# Patient Record
Sex: Female | Born: 1975 | Race: White | Hispanic: No | Marital: Married
Health system: Southern US, Community
[De-identification: ages and names within clinical notes are randomized; demographics above are authoritative.]

---

## 2015-06-14 ENCOUNTER — Other Ambulatory Visit: Payer: Self-pay | Admitting: Unknown Physician Specialty

## 2015-06-14 DIAGNOSIS — N6320 Unspecified lump in the left breast, unspecified quadrant: Secondary | ICD-10-CM

## 2015-06-15 ENCOUNTER — Other Ambulatory Visit: Payer: Self-pay | Admitting: Unknown Physician Specialty

## 2015-06-15 DIAGNOSIS — N6320 Unspecified lump in the left breast, unspecified quadrant: Secondary | ICD-10-CM

## 2015-06-19 ENCOUNTER — Ambulatory Visit
Admission: RE | Admit: 2015-06-19 | Discharge: 2015-06-19 | Disposition: A | Discharge status: Home / Self Care | Payer: PRIVATE HEALTH INSURANCE | Source: Ambulatory Visit | Attending: Unknown Physician Specialty | Admitting: Unknown Physician Specialty

## 2015-06-19 ENCOUNTER — Other Ambulatory Visit: Payer: Self-pay | Admitting: Unknown Physician Specialty

## 2015-06-19 DIAGNOSIS — N6320 Unspecified lump in the left breast, unspecified quadrant: Secondary | ICD-10-CM

## 2015-11-27 ENCOUNTER — Encounter: Payer: Self-pay | Admitting: Pediatrics

## 2017-09-17 IMAGING — MG MM DIAG BREAST TOMO BILATERAL
6 of 10 series · 6 of 30 positions shown · non-contrast
Comparison: Left mammogram and left breast ultrasound 06/05/2009
from [facility] [REDACTED] in the [HOSPITAL], [facility] [[facility]].

CLINICAL DATA: 39-year-old with a very tender palpable lump in the
upper inner left breast which she noticed approximately 1 month ago.
Annual evaluation, right breast.

EXAM:
DIGITAL DIAGNOSTIC BILATERAL MAMMOGRAM WITH 3D TOMOSYNTHESIS WITH
CAD
ULTRASOUND LEFT BREAST

[L CC (1 of 2)]
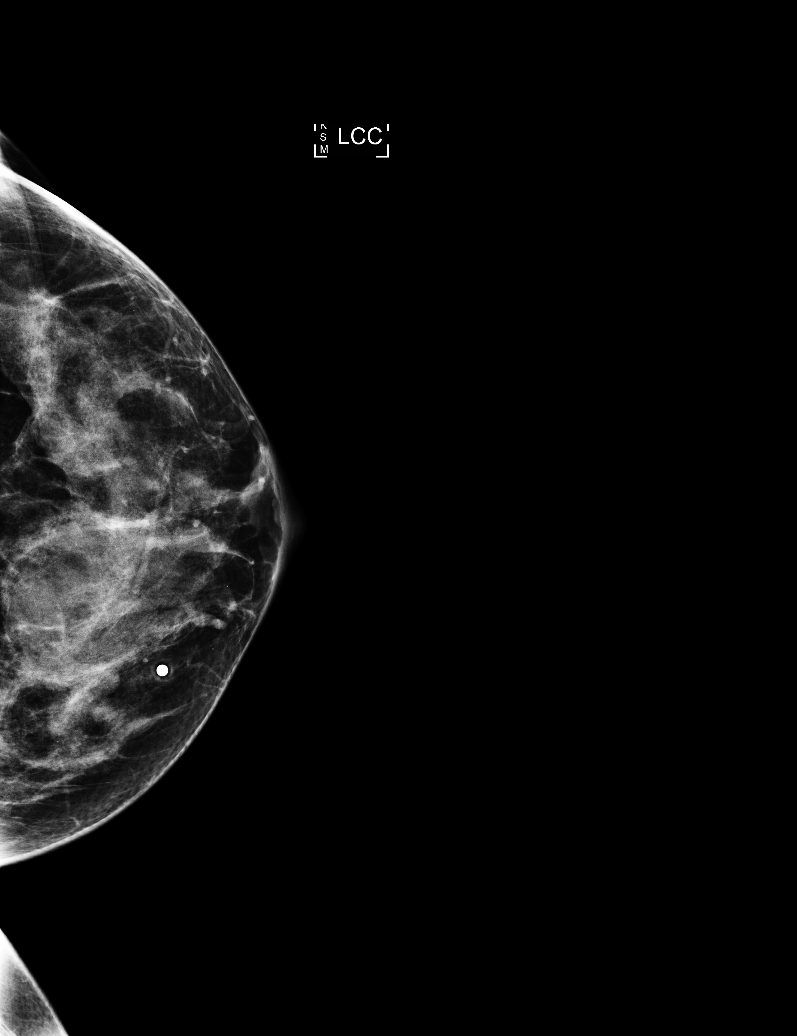

[L CC (2 of 2)]
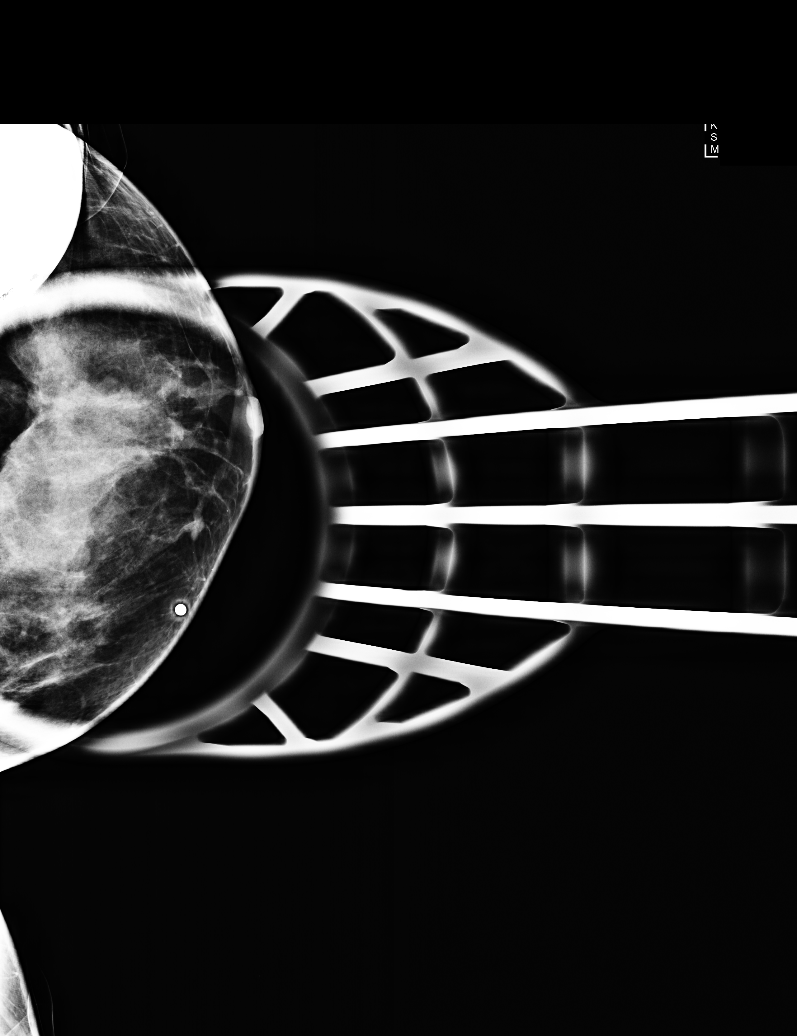

[L MLO]
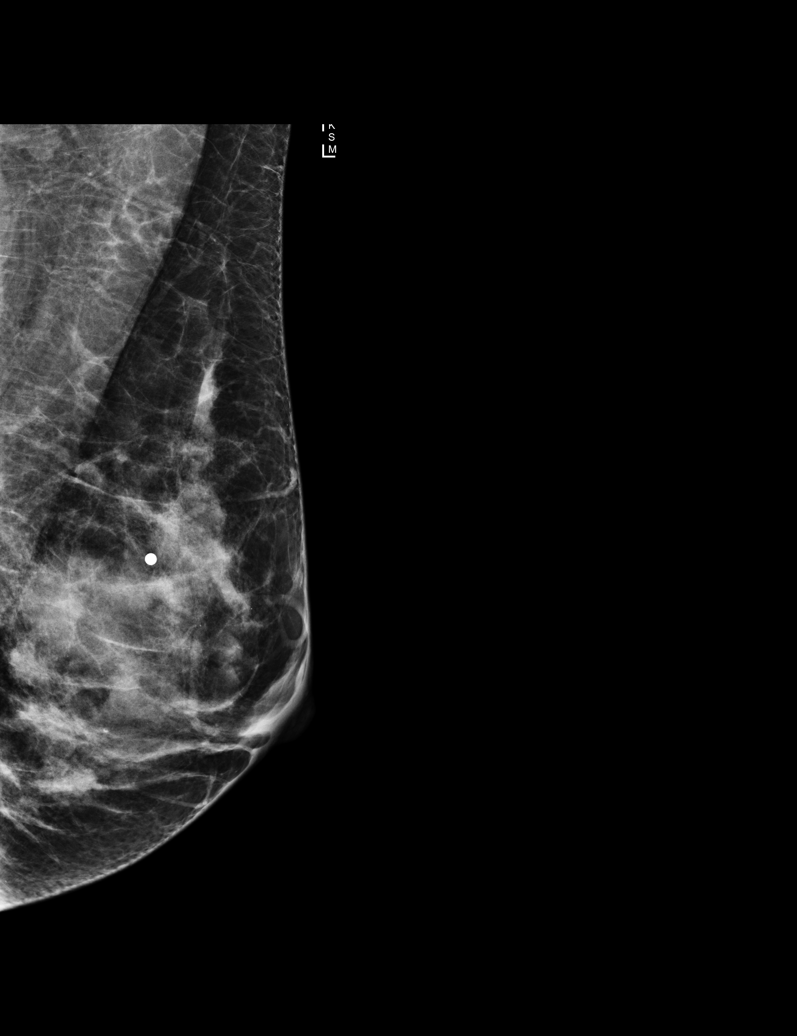

[R MLO]
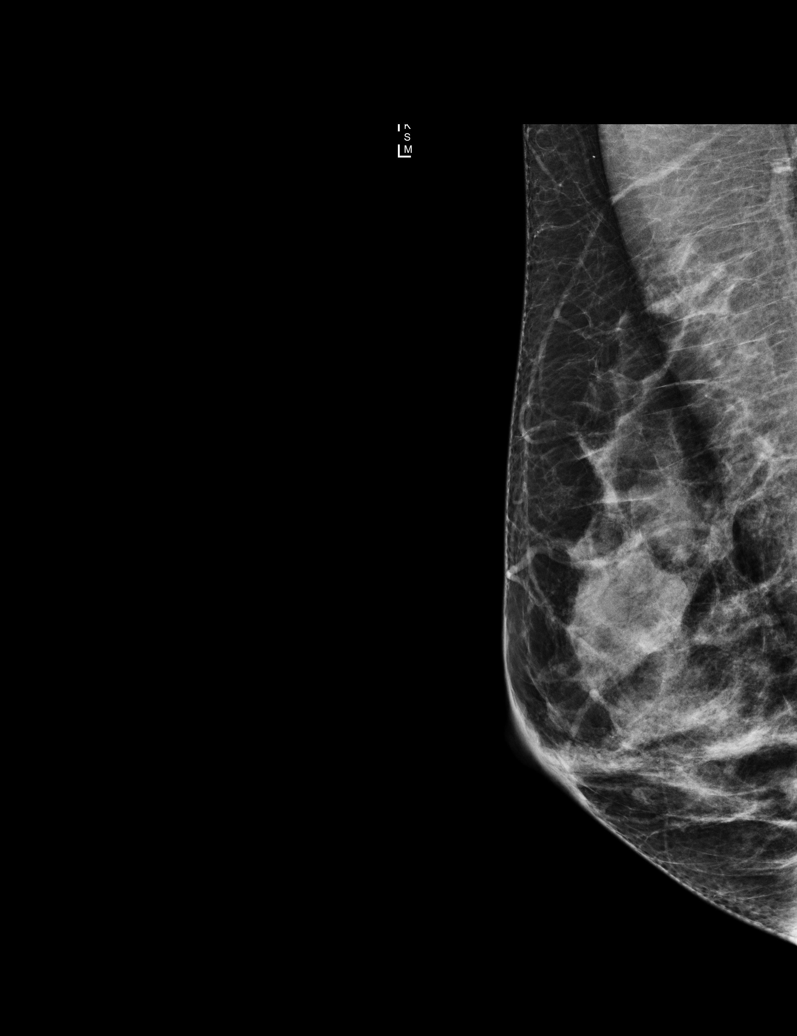

[R CC]
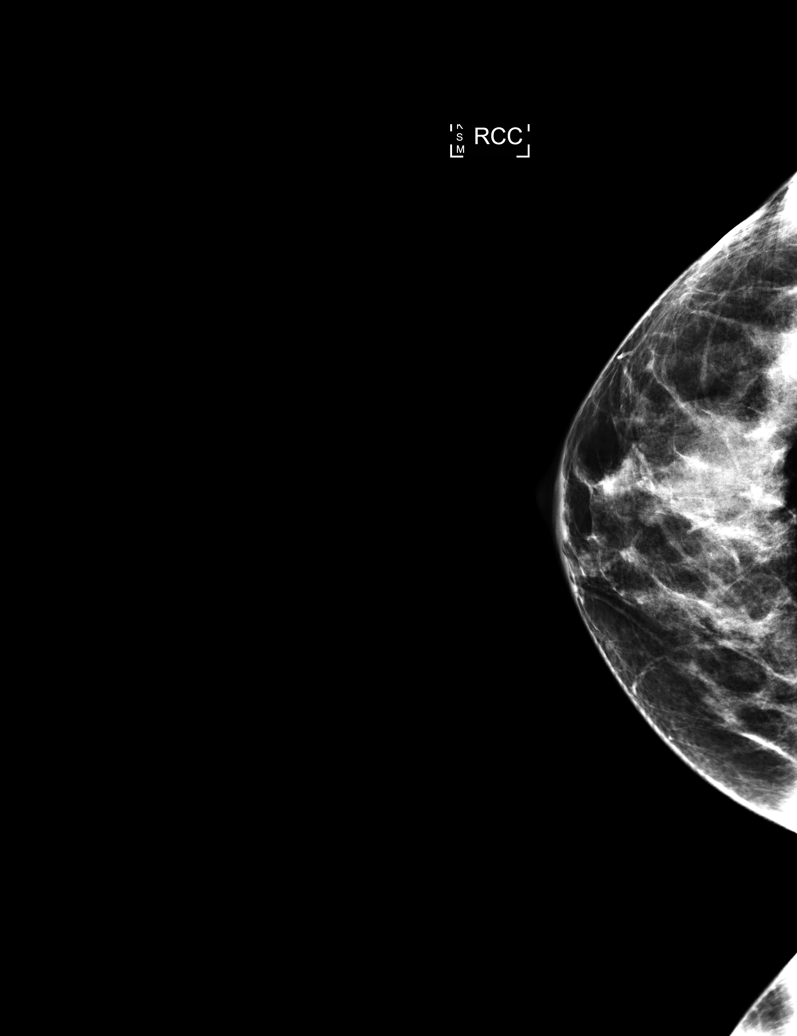

[R MLO tomo · tomo slice 29/57.0]
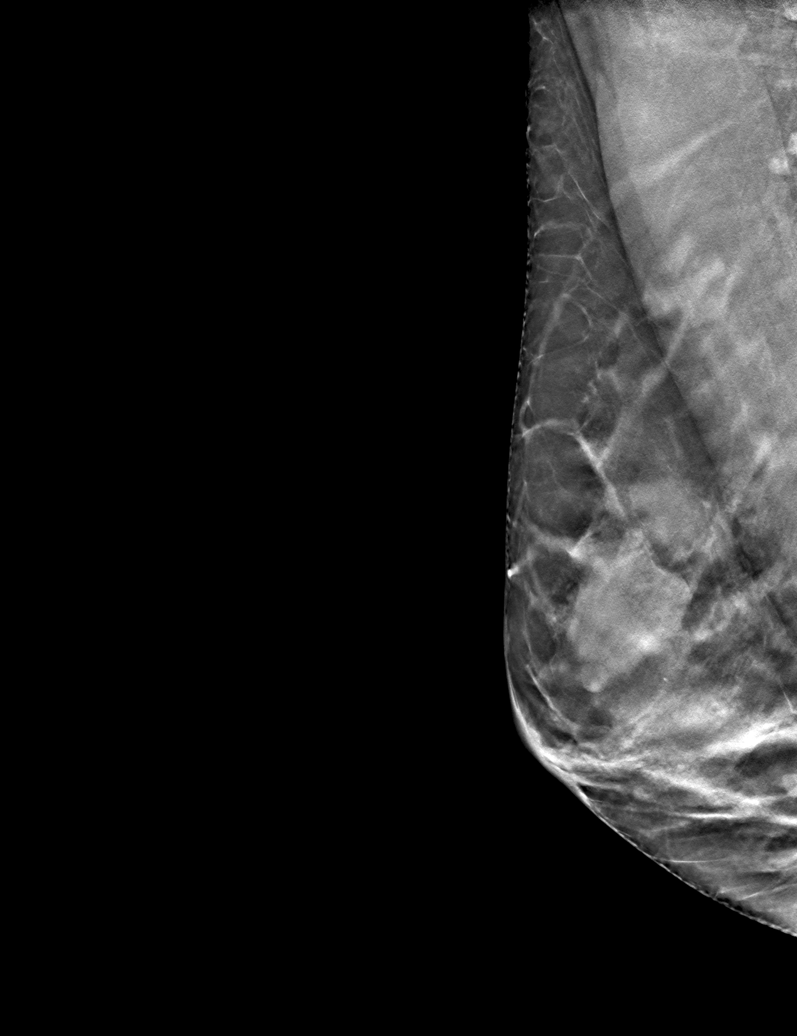

[6 of 30 positions shown; findings below may reference images not displayed]

That
ultrasound was performed in a different part of the breast.

ACR Breast Density Category d: The breast tissue is extremely dense,
which lowers the sensitivity of mammography.
FINDINGS: CC and MLO views of both breasts and a spot tangential view of the
area palpable concern in the left breast were obtained, all with
tomosynthesis. In the area palpable concern on the tangential view
is a circumscribed low-density mass approximating 6 mm without
associated architectural distortion or suspicious calcifications. An
approximate 2 cm circumscribed low-density mass is present deep at
the site of palpable concern, also without associated architectural
distortion or suspicious calcifications. No suspicious findings
elsewhere in the left breast.

No findings suspicious for malignancy in the right breast.

Mammographic images were processed with CAD.

On physical exam, there is a tender palpable approximate 2-3 cm
mobile mass in the upper inner left breast corresponding to what the
patient is feeling.

Targeted left breast ultrasound is performed, showing an oval
circumscribed anechoic mass with internal echoes and no internal
power Doppler flow at the 10:30 o'clock position approximately 3 cm
from the nipple measuring approximately 2.7 x 1.6 x 2.1 cm,
demonstrating excellent acoustic enhancement. Multiple smaller cysts
are present in the upper inner quadrant, one of which corresponds to
the mammographic finding.
IMPRESSION: 1. Likely benign complex cyst in the upper inner quadrant of the
left breast accounting for the area of palpable concern. Multiple
smaller cysts are present in the upper inner quadrant of the left
breast.
2. No mammographic evidence of malignancy, right breast.

RECOMMENDATION:
Ultrasound-guided left breast cyst aspiration to confirm that the
internal contents of the cyst are completely removed in order to
confirm benignity.

I have discussed the findings and recommendations with the patient.
Results were also provided in writing at the conclusion of the
visit.

BI-RADS CATEGORY  0: Incomplete. Need additional imaging evaluation
and/or prior mammograms for comparison.

Ultrasound-guided left breast cyst aspiration was performed
subsequently and is reported separately.

## 2019-03-23 ENCOUNTER — Other Ambulatory Visit: Payer: Self-pay

## 2019-03-23 ENCOUNTER — Ambulatory Visit: Payer: Commercial Managed Care - PPO | Attending: Orthopedic Surgery | Admitting: Physical Therapy

## 2019-03-23 ENCOUNTER — Encounter: Payer: Self-pay | Admitting: Physical Therapy

## 2019-03-23 DIAGNOSIS — M6281 Muscle weakness (generalized): Secondary | ICD-10-CM

## 2019-03-23 DIAGNOSIS — G8929 Other chronic pain: Secondary | ICD-10-CM | POA: Diagnosis present

## 2019-03-23 DIAGNOSIS — M25512 Pain in left shoulder: Secondary | ICD-10-CM

## 2019-03-23 DIAGNOSIS — M25612 Stiffness of left shoulder, not elsewhere classified: Secondary | ICD-10-CM | POA: Diagnosis present

## 2019-03-23 NOTE — Therapy (Signed)
[facility] [address] [place], [place], [address] Phone: [phone]   Fax:  [phone]  Physical Therapy Evaluation  Patient Details  Name: Jillian PiccoloDonna Herring MRN: [id number] Date of Birth: [birth year] Referring Provider (PT): Viviann SpareSteven Case, MD   Encounter Date: 03/23/2019  PT End of Session - 03/23/19 1258    Visit Number  1    Number of Visits  24    Date for PT Re-Evaluation  05/18/19    Authorization Type  FOTO; progress note at 10th visit    PT Start Time  0815    PT Stop Time  0902    PT Time Calculation (min)  47 min    Activity Tolerance  Patient tolerated treatment well    Behavior During Therapy  [facility] for tasks assessed/performed       History reviewed. No pertinent past medical history.  History reviewed. No pertinent surgical history.  There were no vitals filed for this visit.   Subjective Assessment - 03/23/19 1251    Subjective  COVID-19 screening performed upon arrival.Patient arrives to physical therapy with reports of left shoulder pain and decreased ROM that has progressively worsened since July 2020. Patient reports receiving an injection last week which helped alleviate constant pain, no pain only occurs with ROM. Patient requires increased time to perform ADLs particularly dressing and overhead activities. Patient obtains assistance from her daughter for home activities. Patient's pain at worst is 8/10 and pain at best is 0/10 with rest. Patient's goals are to decrease pain improve movement and improve ability to perform home and yard work tasks.    Pertinent History  unremarkable    Limitations  Lifting;House hold activities    Diagnostic tests  MRI: increased inferior glenohumeral ligament thickness; X-Ray: normal    Patient Stated Goals  decrease pain, improve ROM    Currently in Pain?  Yes    Pain Score  1     Pain Location  Shoulder    Pain Orientation  Left    Pain Descriptors / Indicators  Sore;Constant     Pain Type  Chronic pain    Pain Onset  More than a month ago    Pain Frequency  Constant    Aggravating Factors   movement    Pain Relieving Factors  Rest    Effect of Pain on Daily Activities  pain with movement; decreased range, difficulties with dressing         [facility] PT Assessment - 03/23/19 0001      Assessment   Medical Diagnosis  Chronic left shoulder pain; adhesive capsulitis    Referring Provider (PT)  Viviann SpareSteven Case, MD    Onset Date/Surgical Date  --   July 2020   Hand Dominance  Right    Next MD Visit  04/27/2019    Prior Therapy  no      Precautions   Precautions  None      Restrictions   Weight Bearing Restrictions  No      Balance Screen   Has the patient fallen in the past 6 months  Yes    How many times?  1    Has the patient had a decrease in activity level because of a fear of falling?   No    Is the patient reluctant to leave their home because of a fear of falling?   No      Home Nurse, mental healthnvironment   Living Environment  Private residence    Living  Arrangements  Children      Prior Function   Level of Independence  Independent with basic ADLs;Needs assistance with homemaking      Observation/Other Assessments   Focus on Therapeutic Outcomes (FOTO)   53% limitation      Posture/Postural Control   Posture/Postural Control  Postural limitations    Postural Limitations  Rounded Shoulders;Forward head      ROM / Strength   AROM / PROM / Strength  AROM;PROM;Strength      AROM   Overall AROM Comments  full right shoulder AROM in all planes    AROM Assessment Site  Shoulder    Right/Left Shoulder  Left    Left Shoulder Flexion  81 Degrees    Left Shoulder ABduction  54 Degrees    Left Shoulder Internal Rotation  40 Degrees    Left Shoulder External Rotation  5 Degrees      PROM   PROM Assessment Site  Shoulder    Right/Left Shoulder  Left    Left Shoulder Flexion  112 Degrees    Left Shoulder ABduction  60 Degrees    Left Shoulder Internal Rotation   42 Degrees    Left Shoulder External Rotation  9 Degrees      Strength   Strength Assessment Site  Shoulder    Right/Left Shoulder  Left    Left Shoulder Flexion  3-/5    Left Shoulder ABduction  3-/5    Left Shoulder Internal Rotation  3+/5    Left Shoulder External Rotation  3+/5      Palpation   Palpation comment  minimal tenderness to palpation to left shoulder musculature; slight increase of left shoulder muscle tone                Objective measurements completed on examination: See above findings.      [facility] Adult PT Treatment/Exercise - 03/23/19 0001      Modalities   Modalities  Electrical Stimulation      Electrical Stimulation   Electrical Stimulation Location  left shoulder    Electrical Stimulation Action  IFC    Electrical Stimulation Parameters  80-150 hz x10 mins    Electrical Stimulation Goals  Pain             PT Education - 03/23/19 1255    Education Details  AAROM flexion, AAROM abduction, AAROM ER, AAROM scapular squeeze    Person(s) Educated  Patient    Methods  Explanation;Demonstration;Handout    Comprehension  Verbalized understanding;Returned demonstration          PT Long Term Goals - 03/23/19 1301      PT LONG TERM GOAL #1   Title  Patient will be independent with HEP    Time  8    Period  Weeks    Status  New      PT LONG TERM GOAL #2   Title  Patient will demonstrate 150+ degrees of left shoulder flexion AROM to improve ability to perform overhead activities.    Time  8    Period  Weeks    Status  New      PT LONG TERM GOAL #3   Title  Patient will demonstrate 55+ degrees of left shoulder ER AROM to improve donning/doffing apparel.    Time  8    Period  Weeks    Status  New      PT LONG TERM GOAL #4   Title  Patient will demonstrate  4/5 left shoulder MMT in all planes to improve stability during functional tasks.    Time  8    Period  Weeks    Status  New      PT LONG TERM GOAL #5   Title  Patient  will report ability to perform ADLs with left shoulder pain less than or equal to 3/10.    Time  8    Period  Weeks    Status  New             Plan - 03/23/19 1314    Clinical Impression Statement  Patient is a right handed 43 year old female who presents to physical therapy with decreased left shoulder AROM and PROM and decreased left shoulder MMT that has progressively worsened since July 2020. Patient noted with poor scapulohumeral rhythm with ROM and with shoulder hiking compensatory movements with flexion and abduction. Patient denied tenderness to palpation to left shoulder. Patient and PT discussed HEP as well as plan of care to address deficits. Patient reported understanding and agreement. Patient would benefit from skilled physical therapy to address deficits and address patient's goals.    Personal Factors and Comorbidities  Age;Time since onset of injury/illness/exacerbation    Examination-Activity Limitations  Bathing;Reach Overhead;Dressing;Hygiene/Grooming;Lift;Sleep    Examination-Participation Restrictions  Yard Work;Cleaning;Driving    Stability/Clinical Decision Making  Stable/Uncomplicated    Clinical Decision Making  Low    Rehab Potential  Good    PT Frequency  3x / week    PT Duration  8 weeks    PT Treatment/Interventions  ADLs/Self Care Home Management;Cryotherapy;Electrical Stimulation;Iontophoresis 4mg /ml Dexamethasone;Moist Heat;Ultrasound;Therapeutic exercise;Patient/family education;Therapeutic activities;Neuromuscular re-education;Manual techniques;Vasopneumatic Device;Taping;Dry needling;Passive range of motion    PT Next Visit Plan  AROM and PROM to left shoulder, modalities PRN for pain relief.    PT Home Exercise Plan  see patient education    Consulted and Agree with Plan of Care  Patient       Patient will benefit from skilled therapeutic intervention in order to improve the following deficits and impairments:  Decreased activity tolerance, Decreased  strength, Decreased range of motion, Postural dysfunction, Pain  Visit Diagnosis: Chronic left shoulder pain  Stiffness of left shoulder, not elsewhere classified  Muscle weakness (generalized)     Problem List There are no active problems to display for this patient.   , PT, DPT 03/23/2019, 1:18 PM  [facility] [address] [place], [place], [place] Phone: [phone]   Fax:  [phone]  Name: Malynda Smolinski MRN: Jillian Herring Date of Birth: 17-Jan-1976

## 2019-03-25 ENCOUNTER — Other Ambulatory Visit: Payer: Self-pay

## 2019-03-25 ENCOUNTER — Encounter: Payer: Self-pay | Admitting: Physical Therapy

## 2019-03-25 ENCOUNTER — Ambulatory Visit: Payer: Commercial Managed Care - PPO | Admitting: Physical Therapy

## 2019-03-25 DIAGNOSIS — M25612 Stiffness of left shoulder, not elsewhere classified: Secondary | ICD-10-CM

## 2019-03-25 DIAGNOSIS — M6281 Muscle weakness (generalized): Secondary | ICD-10-CM

## 2019-03-25 DIAGNOSIS — M25512 Pain in left shoulder: Secondary | ICD-10-CM | POA: Diagnosis not present

## 2019-03-25 DIAGNOSIS — G8929 Other chronic pain: Secondary | ICD-10-CM

## 2019-03-25 NOTE — Therapy (Signed)
[facility] [place], [place], [address] Phone: [phone]   Fax:  [phone]  Physical Therapy Treatment  Patient Details  Name: Jillian Herring MRN: [id number] Date of Birth: February 18, 1976 Referring Provider (PT): Remo Lipps Case, MD   Encounter Date: 03/25/2019  PT End of Session - 03/25/19 0820    Visit Number  2    Number of Visits  24    Date for PT Re-Evaluation  05/18/19    Authorization Type  FOTO; progress note at 10th visit    PT Start Time  0815    PT Stop Time  0908    PT Time Calculation (min)  53 min    Activity Tolerance  Patient tolerated treatment well    Behavior During Therapy  [facility] for tasks assessed/performed       History reviewed. No pertinent past medical history.  History reviewed. No pertinent surgical history.  There were no vitals filed for this visit.  Subjective Assessment - 03/25/19 0818    Subjective  COVID-19 screening performed upon arrival. Patient reports pain with movement and compliance with HEP. Patient reports performing HEP morning and evenings and evenings are a little stiffer but able to complete.    Pertinent History  unremarkable    Limitations  Lifting;House hold activities    Diagnostic tests  MRI: increased inferior glenohumeral ligament thickness; X-Ray: normal    Patient Stated Goals  decrease pain, improve ROM    Currently in Pain?  Yes    Pain Score  5     Pain Location  Shoulder    Pain Orientation  Left    Pain Descriptors / Indicators  Sore;Tightness    Pain Type  Chronic pain    Pain Onset  More than a month ago    Pain Frequency  Constant         [facility] PT Assessment - 03/25/19 0001      Assessment   Medical Diagnosis  Chronic left shoulder pain; adhesive capsulitis    Referring Provider (PT)  Remo Lipps Case, MD    Hand Dominance  Right    Next MD Visit  04/27/2019    Prior Therapy  no      Precautions   Precautions  None      Restrictions   Weight  Bearing Restrictions  No                   [facility] Adult PT Treatment/Exercise - 03/25/19 0001      Exercises   Exercises  Shoulder      Shoulder Exercises: Pulleys   Flexion  3 minutes      Shoulder Exercises: ROM/Strengthening   Ranger  seated ranger, flexion, clockwise, counter clockwise x2 minutes each      Modalities   Modalities  Electrical Stimulation      Electrical Stimulation   Electrical Stimulation Location  left shouler    Electrical Stimulation Action  IFC    Electrical Stimulation Parameters  80-150 hz x10 mins    Electrical Stimulation Goals  Pain      Manual Therapy   Manual Therapy  Joint mobilization;Passive ROM    Joint Mobilization  inferior and posterior grade 3 joint mobs to improve ROM    Passive ROM  PROM into flexion, ER, IR to improve ROM; intermittent oscillations to decrease muscle guarding.                  PT Long Term Goals -  03/23/19 1301      PT LONG TERM GOAL #1   Title  Patient will be independent with HEP    Time  8    Period  Weeks    Status  New      PT LONG TERM GOAL #2   Title  Patient will demonstrate 150+ degrees of left shoulder flexion AROM to improve ability to perform overhead activities.    Time  8    Period  Weeks    Status  New      PT LONG TERM GOAL #3   Title  Patient will demonstrate 55+ degrees of left shoulder ER AROM to improve donning/doffing apparel.    Time  8    Period  Weeks    Status  New      PT LONG TERM GOAL #4   Title  Patient will demonstrate 4/5 left shoulder MMT in all planes to improve stability during functional tasks.    Time  8    Period  Weeks    Status  New      PT LONG TERM GOAL #5   Title  Patient will report ability to perform ADLs with left shoulder pain less than or equal to 3/10.    Time  8    Period  Weeks    Status  New            Plan - 03/25/19 0913    Clinical Impression Statement  Patient responsed well to therapy but with slight increase of  pain at end range. Patient noted with decreased posterior joint mobility but denied disocomfort with manual technique. Patient educated on plan of care as well importance of continuing HEP to maximize PT. Patient reported understanding. Patient would benefit from skilled physical therapy to address deficits and address patient's goals.    Personal Factors and Comorbidities  Age;Time since onset of injury/illness/exacerbation    Examination-Activity Limitations  Bathing;Reach Overhead;Dressing;Hygiene/Grooming;Lift;Sleep    Examination-Participation Restrictions  Yard Work;Cleaning;Driving    Stability/Clinical Decision Making  Stable/Uncomplicated    Clinical Decision Making  Low    Rehab Potential  Good    PT Frequency  3x / week    PT Treatment/Interventions  ADLs/Self Care Home Management;Cryotherapy;Electrical Stimulation;Iontophoresis 4mg /ml Dexamethasone;Moist Heat;Ultrasound;Therapeutic exercise;Patient/family education;Therapeutic activities;Neuromuscular re-education;Manual techniques;Vasopneumatic Device;Taping;Dry needling;Passive range of motion    PT Next Visit Plan  AROM and PROM to left shoulder, modalities PRN for pain relief.    Consulted and Agree with Plan of Care  Patient       Patient will benefit from skilled therapeutic intervention in order to improve the following deficits and impairments:  Decreased activity tolerance, Decreased strength, Decreased range of motion, Postural dysfunction, Pain  Visit Diagnosis: Chronic left shoulder pain  Stiffness of left shoulder, not elsewhere classified  Muscle weakness (generalized)     Problem List There are no active problems to display for this patient.   , PT, DPT 03/25/2019, 9:18 AM  [facility] [address] [place], [place], [place] Phone: [phone]   Fax:  [phone]  Name: Weronika Birch MRN: [name] Date of Birth: 1976-04-28

## 2019-03-28 ENCOUNTER — Other Ambulatory Visit: Payer: Self-pay

## 2019-03-28 ENCOUNTER — Encounter: Payer: Self-pay | Admitting: Physical Therapy

## 2019-03-28 ENCOUNTER — Ambulatory Visit: Payer: Commercial Managed Care - PPO | Admitting: Physical Therapy

## 2019-03-28 DIAGNOSIS — G8929 Other chronic pain: Secondary | ICD-10-CM

## 2019-03-28 DIAGNOSIS — M6281 Muscle weakness (generalized): Secondary | ICD-10-CM

## 2019-03-28 DIAGNOSIS — M25512 Pain in left shoulder: Secondary | ICD-10-CM | POA: Diagnosis not present

## 2019-03-28 DIAGNOSIS — M25612 Stiffness of left shoulder, not elsewhere classified: Secondary | ICD-10-CM

## 2019-03-28 NOTE — Therapy (Signed)
[facility] [place], [place], [address] Phone: [phone]   Fax:  [phone]  Physical Therapy Treatment  Patient Details  Name: Jillian Herring MRN: [id number] Date of Birth: 1975-07-06 Referring Provider (PT): Remo Lipps Case, MD   Encounter Date: 03/28/2019  PT End of Session - 03/28/19 0916    Visit Number  3    Number of Visits  24    Date for PT Re-Evaluation  05/18/19    Authorization Type  FOTO; progress note at 10th visit    PT Start Time  0906    PT Stop Time  0956    PT Time Calculation (min)  50 min    Activity Tolerance  Patient tolerated treatment well    Behavior During Therapy  [facility] for tasks assessed/performed       History reviewed. No pertinent past medical history.  History reviewed. No pertinent surgical history.  There were no vitals filed for this visit.  Subjective Assessment - 03/28/19 0914    Subjective  COVID-19 screening performed upon arrival. Patient reports feeling better and the intensity of the pain isn't as  bad and the sharp pain is relieving faster.    Pertinent History  unremarkable    Limitations  Lifting;House hold activities    Diagnostic tests  MRI: increased inferior glenohumeral ligament thickness; X-Ray: normal    Patient Stated Goals  decrease pain, improve ROM    Currently in Pain?  Yes   "no pain at rest, just movement"        [facility] PT Assessment - 03/28/19 0001      Assessment   Medical Diagnosis  Chronic left shoulder pain; adhesive capsulitis    Referring Provider (PT)  Remo Lipps Case, MD    Hand Dominance  Right    Next MD Visit  04/27/2019    Prior Therapy  no      Precautions   Precautions  None      Restrictions   Weight Bearing Restrictions  No                   [facility] Adult PT Treatment/Exercise - 03/28/19 0001      Exercises   Exercises  Shoulder      Shoulder Exercises: Standing   Internal Rotation  AAROM;Both;20 reps   behind and up  the back   Extension  AAROM;Both;20 reps      Shoulder Exercises: Pulleys   Flexion  5 minutes      Shoulder Exercises: Stretch   Internal Rotation Stretch  5 reps   20-30 second hold     Modalities   Modalities  Electrical Stimulation;Moist Heat      Moist Heat Therapy   Number Minutes Moist Heat  10 Minutes    Moist Heat Location  Shoulder      Electrical Stimulation   Electrical Stimulation Location  left shoulder    Electrical Stimulation Action  IFC    Electrical Stimulation Parameters  80-150 hz x10 mins    Electrical Stimulation Goals  Pain      Manual Therapy   Manual Therapy  Joint mobilization;Passive ROM    Joint Mobilization  inferior and posterior grade 3 joint mobs to improve ROM    Passive ROM  PROM into flexion, ER, IR with gentle overpressure at end range to improve ROM; intermittent oscillations to decrease muscle guarding.                  PT  Long Term Goals - 03/28/19 1006      PT LONG TERM GOAL #1   Title  Patient will be independent with HEP    Time  8    Period  Weeks    Status  Achieved      PT LONG TERM GOAL #2   Title  Patient will demonstrate 150+ degrees of left shoulder flexion AROM to improve ability to perform overhead activities.    Time  8    Period  Weeks    Status  On-going      PT LONG TERM GOAL #3   Title  Patient will demonstrate 55+ degrees of left shoulder ER AROM to improve donning/doffing apparel.    Time  8    Period  Weeks    Status  On-going      PT LONG TERM GOAL #4   Title  Patient will demonstrate 4/5 left shoulder MMT in all planes to improve stability during functional tasks.    Time  8    Period  Weeks    Status  On-going      PT LONG TERM GOAL #5   Title  Patient will report ability to perform ADLs with left shoulder pain less than or equal to 3/10.    Time  8    Period  Weeks    Status  On-going            Plan - 03/28/19 1003    Clinical Impression Statement  Patient responded well  to progression of AAROM exercises but did note more discomfort with IR stretching. Patient educated to stretch to discomfort but not pain. Patient noted with a decrease in discomfort after cuing. Patient noted with ongoing ER tightness PROM but noted with improved posterior joint mobility. Patient with normal response to modalities upon removal.    Personal Factors and Comorbidities  Age;Time since onset of injury/illness/exacerbation    Examination-Activity Limitations  Bathing;Reach Overhead;Dressing;Hygiene/Grooming;Lift;Sleep    Examination-Participation Restrictions  Yard Work;Cleaning;Driving    Stability/Clinical Decision Making  Stable/Uncomplicated    Clinical Decision Making  Low    Rehab Potential  Good    PT Frequency  3x / week    PT Duration  8 weeks    PT Treatment/Interventions  ADLs/Self Care Home Management;Cryotherapy;Electrical Stimulation;Iontophoresis 4mg /ml Dexamethasone;Moist Heat;Ultrasound;Therapeutic exercise;Patient/family education;Therapeutic activities;Neuromuscular re-education;Manual techniques;Vasopneumatic Device;Taping;Dry needling;Passive range of motion    PT Next Visit Plan  AROM and PROM to left shoulder cont. stretching, modalities PRN for pain relief.    Consulted and Agree with Plan of Care  Patient       Patient will benefit from skilled therapeutic intervention in order to improve the following deficits and impairments:  Decreased activity tolerance, Decreased strength, Decreased range of motion, Postural dysfunction, Pain  Visit Diagnosis: Chronic left shoulder pain  Stiffness of left shoulder, not elsewhere classified  Muscle weakness (generalized)     Problem List There are no active problems to display for this patient.   , PT, DPT 03/28/2019, 10:10 AM  [facility] [address] [place], [place], [place] Phone: [phone]   Fax:  [phone]  Name: Jillian Herring MRN:  [name] Date of Birth: May 01, 1976

## 2019-03-30 ENCOUNTER — Other Ambulatory Visit: Payer: Self-pay

## 2019-03-30 ENCOUNTER — Ambulatory Visit: Payer: Commercial Managed Care - PPO | Admitting: Physical Therapy

## 2019-03-30 ENCOUNTER — Encounter: Payer: Self-pay | Admitting: Physical Therapy

## 2019-03-30 DIAGNOSIS — M25612 Stiffness of left shoulder, not elsewhere classified: Secondary | ICD-10-CM

## 2019-03-30 DIAGNOSIS — M25512 Pain in left shoulder: Secondary | ICD-10-CM | POA: Diagnosis not present

## 2019-03-30 DIAGNOSIS — G8929 Other chronic pain: Secondary | ICD-10-CM

## 2019-03-30 DIAGNOSIS — M6281 Muscle weakness (generalized): Secondary | ICD-10-CM

## 2019-03-30 NOTE — Therapy (Signed)
[facility] [place], [place], [address] Phone: [phone]   Fax:  [phone]  Physical Therapy Treatment  Patient Details  Name: Jillian Herring MRN: [id number] Date of Birth: [birth year] Referring Provider (PT): Remo Lipps Case, MD   Encounter Date: 03/30/2019  PT End of Session - 03/30/19 1220    Visit Number  4    Number of Visits  24    Date for PT Re-Evaluation  05/18/19    Authorization Type  FOTO; progress note at 10th visit    PT Start Time  0815    PT Stop Time  0911    PT Time Calculation (min)  56 min    Activity Tolerance  Patient tolerated treatment well    Behavior During Therapy  [facility] for tasks assessed/performed       History reviewed. No pertinent past medical history.  History reviewed. No pertinent surgical history.  There were no vitals filed for this visit.  Subjective Assessment - 03/30/19 0822    Subjective  COVID-19 screening performed upon arrival. Patient reports feeling a little more sore after last visit but feeling better. 3/10 pain with movement, 0/10 with rest    Pertinent History  unremarkable    Limitations  Lifting;House hold activities    Diagnostic tests  MRI: increased inferior glenohumeral ligament thickness; X-Ray: normal    Patient Stated Goals  decrease pain, improve ROM    Currently in Pain?  Yes    Pain Score  3     Pain Location  Shoulder    Pain Orientation  Left    Pain Descriptors / Indicators  Sore    Pain Type  Chronic pain    Pain Onset  More than a month ago    Pain Frequency  Constant         [facility] PT Assessment - 03/30/19 0001      Assessment   Medical Diagnosis  Chronic left shoulder pain; adhesive capsulitis    Referring Provider (PT)  Remo Lipps Case, MD    Hand Dominance  Right    Next MD Visit  04/27/2019    Prior Therapy  no      Precautions   Precautions  None      Restrictions   Weight Bearing Restrictions  No                    [facility] Adult PT Treatment/Exercise - 03/30/19 0001      Exercises   Exercises  Shoulder      Shoulder Exercises: Supine   External Rotation  AAROM;Left;20 reps    Flexion  AAROM;Both;20 reps      Shoulder Exercises: Pulleys   Flexion  5 minutes      Shoulder Exercises: Stretch   Internal Rotation Stretch  10 seconds    Internal Rotation Stretch Limitations  sleeper stretch, 10x      Modalities   Modalities  Electrical Stimulation;Moist Heat      Moist Heat Therapy   Number Minutes Moist Heat  10 Minutes    Moist Heat Location  Shoulder      Electrical Stimulation   Electrical Stimulation Location  left shoulder    Electrical Stimulation Action  IFC    Electrical Stimulation Parameters  80-150 hz x10 mins    Electrical Stimulation Goals  Pain      Manual Therapy   Manual Therapy  Joint mobilization;Passive ROM    Joint Mobilization  inferior and posterior grade  3 joint mobs to improve ROM    Passive ROM  PROM into flexion, ER, IR with gentle overpressure at end range to improve ROM; intermittent oscillations to decrease muscle guarding.                  PT Long Term Goals - 03/28/19 1006      PT LONG TERM GOAL #1   Title  Patient will be independent with HEP    Time  8    Period  Weeks    Status  Achieved      PT LONG TERM GOAL #2   Title  Patient will demonstrate 150+ degrees of left shoulder flexion AROM to improve ability to perform overhead activities.    Time  8    Period  Weeks    Status  On-going      PT LONG TERM GOAL #3   Title  Patient will demonstrate 55+ degrees of left shoulder ER AROM to improve donning/doffing apparel.    Time  8    Period  Weeks    Status  On-going      PT LONG TERM GOAL #4   Title  Patient will demonstrate 4/5 left shoulder MMT in all planes to improve stability during functional tasks.    Time  8    Period  Weeks    Status  On-going      PT LONG TERM GOAL #5   Title  Patient will  report ability to perform ADLs with left shoulder pain less than or equal to 3/10.    Time  8    Period  Weeks    Status  On-going            Plan - 03/30/19 0906    Clinical Impression Statement  Patient responded well but with more pain with IR sleeper stretch. Patient noted with tenderness upon STW/M to anterior shoulder but noted with improved relaxation with ER PROM. Patient provided with new HEP of IR stretching to which patient reported understanding. Normal response to modalities upon removal.    Personal Factors and Comorbidities  Age;Time since onset of injury/illness/exacerbation    Examination-Activity Limitations  Bathing;Reach Overhead;Dressing;Hygiene/Grooming;Lift;Sleep    Examination-Participation Restrictions  Yard Work;Cleaning;Driving    Stability/Clinical Decision Making  Stable/Uncomplicated    Clinical Decision Making  Low    Rehab Potential  Good    PT Frequency  3x / week    PT Duration  8 weeks    PT Treatment/Interventions  ADLs/Self Care Home Management;Cryotherapy;Electrical Stimulation;Iontophoresis 4mg /ml Dexamethasone;Moist Heat;Ultrasound;Therapeutic exercise;Patient/family education;Therapeutic activities;Neuromuscular re-education;Manual techniques;Vasopneumatic Device;Taping;Dry needling;Passive range of motion    PT Next Visit Plan  AROM and PROM to left shoulder cont. stretching, modalities PRN for pain relief.    PT Home Exercise Plan  IR sleeper stretch, standing behind the back stretch    Consulted and Agree with Plan of Care  Patient       Patient will benefit from skilled therapeutic intervention in order to improve the following deficits and impairments:  Decreased activity tolerance, Decreased strength, Decreased range of motion, Postural dysfunction, Pain  Visit Diagnosis: Chronic left shoulder pain  Stiffness of left shoulder, not elsewhere classified  Muscle weakness (generalized)     Problem List There are no active problems  to display for this patient.   , PT, DPT 03/30/2019, 1:23 PM  [facility] [address] [place], [place], [place] Phone: [phone]   Fax:  [phone]  Name:  Francee PiccoloDonna Hammitt MRN: [id number] Date of Birth: 1975/06/30

## 2019-04-04 ENCOUNTER — Other Ambulatory Visit: Payer: Self-pay

## 2019-04-04 ENCOUNTER — Ambulatory Visit: Payer: Commercial Managed Care - PPO | Attending: Orthopedic Surgery | Admitting: Physical Therapy

## 2019-04-04 DIAGNOSIS — M25512 Pain in left shoulder: Secondary | ICD-10-CM | POA: Diagnosis present

## 2019-04-04 DIAGNOSIS — M6281 Muscle weakness (generalized): Secondary | ICD-10-CM | POA: Insufficient documentation

## 2019-04-04 DIAGNOSIS — M25612 Stiffness of left shoulder, not elsewhere classified: Secondary | ICD-10-CM | POA: Diagnosis present

## 2019-04-04 DIAGNOSIS — G8929 Other chronic pain: Secondary | ICD-10-CM | POA: Insufficient documentation

## 2019-04-04 NOTE — Therapy (Signed)
[facility] [place], [place], [address] Phone: [phone]   Fax:  [phone]  Physical Therapy Treatment  Patient Details  Name: Jillian Herring MRN: [id number] Date of Birth: 10-May-1976 Referring Provider (PT): Remo Lipps Case, MD   Encounter Date: 04/04/2019  PT End of Session - 04/04/19 1023    Visit Number  5    Number of Visits  24    Date for PT Re-Evaluation  05/18/19    Authorization Type  FOTO; progress note at 10th visit    PT Start Time  0948   late arrival   PT Stop Time  1031    PT Time Calculation (min)  43 min    Activity Tolerance  Patient tolerated treatment well    Behavior During Therapy  [facility] for tasks assessed/performed       No past medical history on file.  No past surgical history on file.  There were no vitals filed for this visit.      [facility] PT Assessment - 04/04/19 0001      Assessment   Medical Diagnosis  Chronic left shoulder pain; adhesive capsulitis    Referring Provider (PT)  Remo Lipps Case, MD    Hand Dominance  Right    Next MD Visit  04/27/2019    Prior Therapy  no      PROM   Left Shoulder Flexion  130 Degrees    Left Shoulder ABduction  83 Degrees    Left Shoulder Internal Rotation  58 Degrees    Left Shoulder External Rotation  30 Degrees                   [facility] Adult PT Treatment/Exercise - 04/04/19 0001      Exercises   Exercises  Shoulder      Shoulder Exercises: Pulleys   Flexion  5 minutes      Modalities   Modalities  Electrical Stimulation;Moist Heat      Moist Heat Therapy   Number Minutes Moist Heat  10 Minutes    Moist Heat Location  Shoulder      Electrical Stimulation   Electrical Stimulation Location  left shoulder    Electrical Stimulation Action  IFC    Electrical Stimulation Parameters  80-150 hz x10 mins    Electrical Stimulation Goals  Pain      Manual Therapy   Manual Therapy  Joint mobilization;Passive ROM    Joint  Mobilization  inferior and posterior grade 3-4 joint mobs to improve ROM    Passive ROM  PROM into flexion, ER, IR with gentle overpressure at end range to improve ROM; intermittent oscillations to decrease muscle guarding.             PT Education - 04/04/19 1048    Education Details  corner stretch, doorway stretch, lat stretch, supine horizontal abd stretch    Person(s) Educated  Patient    Methods  Explanation    Comprehension  Verbalized understanding;Returned demonstration          PT Long Term Goals - 03/28/19 1006      PT LONG TERM GOAL #1   Title  Patient will be independent with HEP    Time  8    Period  Weeks    Status  Achieved      PT LONG TERM GOAL #2   Title  Patient will demonstrate 150+ degrees of left shoulder flexion AROM to improve ability to perform overhead activities.  Time  8    Period  Weeks    Status  On-going      PT LONG TERM GOAL #3   Title  Patient will demonstrate 55+ degrees of left shoulder ER AROM to improve donning/doffing apparel.    Time  8    Period  Weeks    Status  On-going      PT LONG TERM GOAL #4   Title  Patient will demonstrate 4/5 left shoulder MMT in all planes to improve stability during functional tasks.    Time  8    Period  Weeks    Status  On-going      PT LONG TERM GOAL #5   Title  Patient will report ability to perform ADLs with left shoulder pain less than or equal to 3/10.    Time  8    Period  Weeks    Status  On-going            Plan - 04/04/19 1042    Clinical Impression Statement  Patient responded well to therapy session. PROM emphasized to address ongoing stiffness. Patient noted with smooth arc of motion and with slight increase of pain at end range. Patient provided with new HEP stretching to which patient reported understanding. PROM measurements performed with improvements, see objective measurements. Normal response to modalities upon removal.    Personal Factors and Comorbidities   Age;Time since onset of injury/illness/exacerbation    Examination-Activity Limitations  Bathing;Reach Overhead;Dressing;Hygiene/Grooming;Lift;Sleep    Examination-Participation Restrictions  Yard Work;Cleaning;Driving    Stability/Clinical Decision Making  Stable/Uncomplicated    Clinical Decision Making  Low    Rehab Potential  Good    PT Frequency  3x / week    PT Duration  8 weeks    PT Treatment/Interventions  ADLs/Self Care Home Management;Cryotherapy;Electrical Stimulation;Iontophoresis 4mg /ml Dexamethasone;Moist Heat;Ultrasound;Therapeutic exercise;Patient/family education;Therapeutic activities;Neuromuscular re-education;Manual techniques;Vasopneumatic Device;Taping;Dry needling;Passive range of motion    PT Next Visit Plan  FOTO; assess goals. AROM and PROM to left shoulder cont. stretching, modalities PRN for pain relief.    PT Home Exercise Plan  see patient education section    Consulted and Agree with Plan of Care  Patient       Patient will benefit from skilled therapeutic intervention in order to improve the following deficits and impairments:  Decreased activity tolerance, Decreased strength, Decreased range of motion, Postural dysfunction, Pain  Visit Diagnosis: Chronic left shoulder pain  Stiffness of left shoulder, not elsewhere classified  Muscle weakness (generalized)     Problem List There are no active problems to display for this patient.   , PT, DPT 04/04/2019, 10:52 AM  [facility] [address] [place], [place], [place] Phone: [phone]   Fax:  [phone]  Name: Jillian Herring MRN: [name] Date of Birth: Jun 23, 1975

## 2019-04-07 ENCOUNTER — Other Ambulatory Visit: Payer: Self-pay

## 2019-04-07 ENCOUNTER — Ambulatory Visit: Payer: Commercial Managed Care - PPO | Admitting: Physical Therapy

## 2019-04-07 ENCOUNTER — Encounter: Payer: Self-pay | Admitting: Physical Therapy

## 2019-04-07 DIAGNOSIS — M25512 Pain in left shoulder: Secondary | ICD-10-CM

## 2019-04-07 DIAGNOSIS — M25612 Stiffness of left shoulder, not elsewhere classified: Secondary | ICD-10-CM

## 2019-04-07 DIAGNOSIS — M6281 Muscle weakness (generalized): Secondary | ICD-10-CM

## 2019-04-07 DIAGNOSIS — G8929 Other chronic pain: Secondary | ICD-10-CM

## 2019-04-07 NOTE — Therapy (Signed)
[facility] [address] [place], [place], [address] Phone: [phone]   Fax:  [phone]  Physical Therapy Treatment  Patient Details  Name: Jillian Herring MRN: [id number] Date of Birth: 21-Jun-1975 Referring Provider (PT): Viviann Spare Case, MD   Encounter Date: 04/07/2019  PT End of Session - 04/07/19 0911    Visit Number  6    Number of Visits  24    Date for PT Re-Evaluation  05/18/19    Authorization Type  FOTO; progress note at 10th visit    PT Start Time  0816    PT Stop Time  0906    PT Time Calculation (min)  50 min    Activity Tolerance  Patient tolerated treatment well    Behavior During Therapy  [facility] for tasks assessed/performed       History reviewed. No pertinent past medical history.  History reviewed. No pertinent surgical history.  There were no vitals filed for this visit.  Subjective Assessment - 04/07/19 1039    Subjective  COVID-19 screening performed upon arrival. Patient reports being able to do more with less soreness and pain.    Pertinent History  unremarkable    Limitations  Lifting;House hold activities    Diagnostic tests  MRI: increased inferior glenohumeral ligament thickness; X-Ray: normal    Patient Stated Goals  decrease pain, improve ROM    Currently in Pain?  Yes   no number on pain scale provided.        [facility] PT Assessment - 04/07/19 0001      Assessment   Medical Diagnosis  Chronic left shoulder pain; adhesive capsulitis    Referring Provider (PT)  Viviann Spare Case, MD    Hand Dominance  Right    Next MD Visit  04/27/2019    Prior Therapy  no      Precautions   Precautions  None      Observation/Other Assessments   Focus on Therapeutic Outcomes (FOTO)   45% limitation                   [facility] Adult PT Treatment/Exercise - 04/07/19 0001      Exercises   Exercises  Shoulder      Shoulder Exercises: Pulleys   Flexion  5 minutes      Modalities   Modalities  Electrical  Stimulation;Vasopneumatic;Ultrasound      Moist Heat Therapy   Number Minutes Moist Heat  --    Moist Heat Location  --      Electrical Stimulation   Electrical Stimulation Location  left shoulder    Electrical Stimulation Action  IFC    Electrical Stimulation Parameters  80-150 hz x10 mins      Ultrasound   Ultrasound Location  anterior shoulder    Ultrasound Parameters  1.3 w/cm2, 3.3 mhz x100% x8 with low load duration stretch in ER    Ultrasound Goals  Pain      Vasopneumatic   Number Minutes Vasopneumatic   10 minutes    Vasopnuematic Location   Shoulder    Vasopneumatic Pressure  Low    Vasopneumatic Temperature   56      Manual Therapy   Manual Therapy  Joint mobilization;Passive ROM    Joint Mobilization  inferior and posterior grade 3-4 joint mobs to improve ROM    Passive ROM  low load duration stretch into ER with UltrasoundPROM into flexion, ER, IR with gentle overpressure at end range to improve ROM; intermittent oscillations  to decrease muscle guarding.                  PT Long Term Goals - 03/28/19 1006      PT LONG TERM GOAL #1   Title  Patient will be independent with HEP    Time  8    Period  Weeks    Status  Achieved      PT LONG TERM GOAL #2   Title  Patient will demonstrate 150+ degrees of left shoulder flexion AROM to improve ability to perform overhead activities.    Time  8    Period  Weeks    Status  On-going      PT LONG TERM GOAL #3   Title  Patient will demonstrate 55+ degrees of left shoulder ER AROM to improve donning/doffing apparel.    Time  8    Period  Weeks    Status  On-going      PT LONG TERM GOAL #4   Title  Patient will demonstrate 4/5 left shoulder MMT in all planes to improve stability during functional tasks.    Time  8    Period  Weeks    Status  On-going      PT LONG TERM GOAL #5   Title  Patient will report ability to perform ADLs with left shoulder pain less than or equal to 3/10.    Time  8    Period   Weeks    Status  On-going            Plan - 04/07/19 0911    Clinical Impression Statement  Patient responded well to therapy session but did report increased pain and pinching in posterior shoulder with low load duration ER stretching with [place]. Patient noted with improved ROM but still with tightness in all planes and pain with horizontal abduction PROM. Patient reports overall improvement with function as is objectively seen by FOTO limitaiton to 45%. Patient educated to continue HEP, trying moist heat to shoulder while performing stretching and attempting to take pain medication to for comfort. Patient reported understanding. Vasopneumatic device initiated today with no adverse affects.    Personal Factors and Comorbidities  Age;Time since onset of injury/illness/exacerbation    Examination-Activity Limitations  Bathing;Reach Overhead;Dressing;Hygiene/Grooming;Lift;Sleep    Examination-Participation Restrictions  Yard Work;Cleaning;Driving    Stability/Clinical Decision Making  Stable/Uncomplicated    Clinical Decision Making  Low    Rehab Potential  Good    PT Frequency  3x / week    PT Duration  8 weeks    PT Treatment/Interventions  ADLs/Self Care Home Management;Cryotherapy;Electrical Stimulation;Iontophoresis 4mg /ml Dexamethasone;Moist Heat;Ultrasound;Therapeutic exercise;Patient/family education;Therapeutic activities;Neuromuscular re-education;Manual techniques;Vasopneumatic Device;Taping;Dry needling;Passive range of motion    PT Next Visit Plan  assess goals. AROM and PROM to left shoulder cont. stretching, modalities PRN for pain relief.    PT Home Exercise Plan  see patient education section    Consulted and Agree with Plan of Care  Patient       Patient will benefit from skilled therapeutic intervention in order to improve the following deficits and impairments:  Decreased activity tolerance, Decreased strength, Decreased range of motion, Postural dysfunction, Pain  Visit  Diagnosis: Chronic left shoulder pain  Stiffness of left shoulder, not elsewhere classified  Muscle weakness (generalized)     Problem List There are no active problems to display for this patient.   Gabriela Eves, PT, DPT 04/07/2019, 10:55 AM  [facility] [place]  [address] [place], [place], [address] Phone: [phone]   Fax:  [phone]  Name: Jillian Herring MRN: [id number] Date of Birth: August 17, 1975

## 2019-04-11 ENCOUNTER — Encounter: Payer: Self-pay | Admitting: Physical Therapy

## 2019-04-11 ENCOUNTER — Ambulatory Visit: Payer: Commercial Managed Care - PPO | Admitting: Physical Therapy

## 2019-04-11 ENCOUNTER — Other Ambulatory Visit: Payer: Self-pay

## 2019-04-11 DIAGNOSIS — M25512 Pain in left shoulder: Secondary | ICD-10-CM

## 2019-04-11 DIAGNOSIS — M25612 Stiffness of left shoulder, not elsewhere classified: Secondary | ICD-10-CM

## 2019-04-11 DIAGNOSIS — M6281 Muscle weakness (generalized): Secondary | ICD-10-CM

## 2019-04-11 DIAGNOSIS — G8929 Other chronic pain: Secondary | ICD-10-CM

## 2019-04-11 NOTE — Therapy (Signed)
[facility] [address] [place], [place], [address] Phone: [phone]   Fax:  [phone]  Physical Therapy Treatment  Patient Details  Name: Jillian Herring MRN: [id number] Date of Birth: 01-02-1976 Referring Provider (PT): Viviann Spare Case, MD   Encounter Date: 04/11/2019  PT End of Session - 04/11/19 1229    Visit Number  7    Number of Visits  24    Date for PT Re-Evaluation  05/18/19    Authorization Type  FOTO; progress note at 10th visit    PT Start Time  1033    PT Stop Time  1120    PT Time Calculation (min)  47 min    Activity Tolerance  Patient tolerated treatment well    Behavior During Therapy  [facility] for tasks assessed/performed       History reviewed. No pertinent past medical history.  History reviewed. No pertinent surgical history.  There were no vitals filed for this visit.  Subjective Assessment - 04/11/19 1227    Subjective  COVID-19 screening performed upon arrival. Patient reports doing well.    Pertinent History  unremarkable    Limitations  Lifting;House hold activities    Diagnostic tests  MRI: increased inferior glenohumeral ligament thickness; X-Ray: normal    Patient Stated Goals  decrease pain, improve ROM    Currently in Pain?  Yes   "only with movement"        [facility] PT Assessment - 04/11/19 0001      Assessment   Medical Diagnosis  Chronic left shoulder pain; adhesive capsulitis    Referring Provider (PT)  Viviann Spare Case, MD    Hand Dominance  Right    Next MD Visit  04/27/2019    Prior Therapy  no      Precautions   Precautions  None                   [facility] Adult PT Treatment/Exercise - 04/11/19 0001      Exercises   Exercises  Shoulder      Shoulder Exercises: Pulleys   Flexion  5 minutes      Modalities   Modalities  Electrical Stimulation;Moist Heat      Moist Heat Therapy   Number Minutes Moist Heat  10 Minutes    Moist Heat Location  Shoulder      Electrical  Stimulation   Electrical Stimulation Location  left shoulder    Electrical Stimulation Action  IFC    Electrical Stimulation Parameters  80-150 hz x10 mins    Electrical Stimulation Goals  Pain      Ultrasound   Ultrasound Location  anterior left shoulder    Ultrasound Parameters  1.2 w/cm2, 3.3 mhz, 50%    Ultrasound Goals  Pain      Manual Therapy   Manual Therapy  Joint mobilization;Passive ROM    Joint Mobilization  inferior and posterior grade 3-4 joint mobs to improve ROM    Passive ROM  low load duration stretch into ER with Ultrasound for 3 mins, PROM into flexion, ER, IR, and horizontal abduction with gentle overpressure at end range to improve ROM; intermittent oscillations to decrease muscle guarding.                  PT Long Term Goals - 03/28/19 1006      PT LONG TERM GOAL #1   Title  Patient will be independent with HEP    Time  8  Period  Weeks    Status  Achieved      PT LONG TERM GOAL #2   Title  Patient will demonstrate 150+ degrees of left shoulder flexion AROM to improve ability to perform overhead activities.    Time  8    Period  Weeks    Status  On-going      PT LONG TERM GOAL #3   Title  Patient will demonstrate 55+ degrees of left shoulder ER AROM to improve donning/doffing apparel.    Time  8    Period  Weeks    Status  On-going      PT LONG TERM GOAL #4   Title  Patient will demonstrate 4/5 left shoulder MMT in all planes to improve stability during functional tasks.    Time  8    Period  Weeks    Status  On-going      PT LONG TERM GOAL #5   Title  Patient will report ability to perform ADLs with left shoulder pain less than or equal to 3/10.    Time  8    Period  Weeks    Status  On-going            Plan - 04/11/19 1230    Clinical Impression Statement  Patient responded well to therapy session but experiences the sharp pinching pain again with low load long duration ER stretching with ultrasound. ER stretching  terminated but continued with [place] with no pain. Patient still demonstrates tightness with passive ER but notable improvement at end of left shoulder PROM. Patient and PT discussed continuing at 3x/week to address ongoing goals. Patient reported understanding. Patient prefers heat to vasopneumatic device and no adverse affects were noted upon removal.    Personal Factors and Comorbidities  Age;Time since onset of injury/illness/exacerbation    Examination-Activity Limitations  Bathing;Reach Overhead;Dressing;Hygiene/Grooming;Lift;Sleep    Examination-Participation Restrictions  Yard Work;Cleaning;Driving    Stability/Clinical Decision Making  Stable/Uncomplicated    Clinical Decision Making  Low    Rehab Potential  Good    PT Frequency  3x / week    PT Duration  8 weeks    PT Treatment/Interventions  ADLs/Self Care Home Management;Cryotherapy;Electrical Stimulation;Iontophoresis 4mg /ml Dexamethasone;Moist Heat;Ultrasound;Therapeutic exercise;Patient/family education;Therapeutic activities;Neuromuscular re-education;Manual techniques;Vasopneumatic Device;Taping;Dry needling;Passive range of motion    PT Next Visit Plan  cont with AROM and PROM to left shoulder cont. stretching, modalities PRN for pain relief.    PT Home Exercise Plan  see patient education section    Consulted and Agree with Plan of Care  Patient       Patient will benefit from skilled therapeutic intervention in order to improve the following deficits and impairments:  Decreased activity tolerance, Decreased strength, Decreased range of motion, Postural dysfunction, Pain  Visit Diagnosis: Chronic left shoulder pain  Stiffness of left shoulder, not elsewhere classified  Muscle weakness (generalized)     Problem List There are no active problems to display for this patient.   Cherrie Distance, DPT 04/11/2019, 12:40 PM  [facility] [place], [place],  [address] Phone: [phone]   Fax:  [phone]  Name: Patrick Sohm MRN: [id number] Date of Birth: 05-06-1976

## 2019-04-13 ENCOUNTER — Other Ambulatory Visit: Payer: Self-pay

## 2019-04-13 ENCOUNTER — Encounter: Payer: Self-pay | Admitting: Physical Therapy

## 2019-04-13 ENCOUNTER — Ambulatory Visit: Payer: Commercial Managed Care - PPO | Admitting: Physical Therapy

## 2019-04-13 DIAGNOSIS — G8929 Other chronic pain: Secondary | ICD-10-CM

## 2019-04-13 DIAGNOSIS — M25612 Stiffness of left shoulder, not elsewhere classified: Secondary | ICD-10-CM

## 2019-04-13 DIAGNOSIS — M25512 Pain in left shoulder: Secondary | ICD-10-CM | POA: Diagnosis not present

## 2019-04-13 DIAGNOSIS — M6281 Muscle weakness (generalized): Secondary | ICD-10-CM

## 2019-04-13 NOTE — Therapy (Signed)
[facility] [address] [place], [place], [address] Phone: [phone]   Fax:  [phone]  Physical Therapy Treatment  Patient Details  Name: Jillian Herring MRN: [id number] Date of Birth: 1975-12-22 Referring Provider (PT): Viviann Spare Case, MD   Encounter Date: 04/13/2019  PT End of Session - 04/13/19 1244    Visit Number  8    Number of Visits  24    Date for PT Re-Evaluation  05/18/19    Authorization Type  FOTO; progress note at 10th visit    PT Start Time  1033    PT Stop Time  1120    PT Time Calculation (min)  47 min    Activity Tolerance  Patient tolerated treatment well    Behavior During Therapy  [facility] for tasks assessed/performed       History reviewed. No pertinent past medical history.  History reviewed. No pertinent surgical history.  There were no vitals filed for this visit.  Subjective Assessment - 04/13/19 1242    Subjective  COVID-19 screening performed upon arrival. Patient reports doing well and notices less pain with daily activities, 2-3/10. No pain at rest.    Pertinent History  unremarkable    Limitations  Lifting;House hold activities    Diagnostic tests  MRI: increased inferior glenohumeral ligament thickness; X-Ray: normal    Patient Stated Goals  decrease pain, improve ROM    Currently in Pain?  Yes   at rest 0/10; 5-6/10 with stretching        [facility] PT Assessment - 04/13/19 0001      Assessment   Medical Diagnosis  Chronic left shoulder pain; adhesive capsulitis    Referring Provider (PT)  Viviann Spare Case, MD    Hand Dominance  Right    Next MD Visit  04/27/2019    Prior Therapy  no      AROM   Left Shoulder Flexion  122 Degrees    Left Shoulder External Rotation  34 Degrees                   [facility] Adult PT Treatment/Exercise - 04/13/19 0001      Exercises   Exercises  Shoulder      Shoulder Exercises: ROM/Strengthening   UBE (Upper Arm Bike)  120 RPM x6 minutes      Modalities   Modalities  Electrical Stimulation;Moist Heat      Moist Heat Therapy   Number Minutes Moist Heat  10 Minutes    Moist Heat Location  Shoulder      Electrical Stimulation   Electrical Stimulation Location  left shoulder    Electrical Stimulation Action  IFC    Electrical Stimulation Parameters  80-150 hz x10 mins    Electrical Stimulation Goals  Pain      Manual Therapy   Manual Therapy  Joint mobilization;Passive ROM    Joint Mobilization  inferior and posterior grade 3-4 joint mobs to improve ROM    Passive ROM  low load duration stretch into ER, flexion, and horizontal abduction. intermittent oscillations to decrease muscle guarding.                  PT Long Term Goals - 04/13/19 1249      PT LONG TERM GOAL #1   Title  Patient will be independent with HEP    Time  8    Period  Weeks    Status  Achieved      PT LONG TERM GOAL #  2   Title  Patient will demonstrate 150+ degrees of left shoulder flexion AROM to improve ability to perform overhead activities.    Time  8    Period  Weeks    Status  On-going      PT LONG TERM GOAL #3   Title  Patient will demonstrate 55+ degrees of left shoulder ER AROM to improve donning/doffing apparel.    Time  8    Period  Weeks    Status  On-going      PT LONG TERM GOAL #4   Title  Patient will demonstrate 4/5 left shoulder MMT in all planes to improve stability during functional tasks.    Time  8    Period  Weeks    Status  On-going      PT LONG TERM GOAL #5   Title  Patient will report ability to perform ADLs with left shoulder pain less than or equal to 3/10.    Time  8    Period  Weeks    Status  Achieved            Plan - 04/13/19 1246    Clinical Impression Statement  Patient responded well to the progression of exercises to UBE. Patient denied pain with UBE. Patient noted with improvements in horizontal abduction and flexion left shoulder PROM, see measurements. No adverse affects upon removal  of modalities.    Personal Factors and Comorbidities  Age;Time since onset of injury/illness/exacerbation    Examination-Activity Limitations  Bathing;Reach Overhead;Dressing;Hygiene/Grooming;Lift;Sleep    Examination-Participation Restrictions  Yard Work;Cleaning;Driving    Stability/Clinical Decision Making  Stable/Uncomplicated    Clinical Decision Making  Low    Rehab Potential  Good    PT Frequency  3x / week    PT Duration  8 weeks    PT Treatment/Interventions  ADLs/Self Care Home Management;Cryotherapy;Electrical Stimulation;Iontophoresis 4mg /ml Dexamethasone;Moist Heat;Ultrasound;Therapeutic exercise;Patient/family education;Therapeutic activities;Neuromuscular re-education;Manual techniques;Vasopneumatic Device;Taping;Dry needling;Passive range of motion    PT Next Visit Plan  UBE cont with AROM and PROM to left shoulder cont. stretching, modalities PRN for pain relief.    PT Home Exercise Plan  see patient education section       Patient will benefit from skilled therapeutic intervention in order to improve the following deficits and impairments:  Decreased activity tolerance, Decreased strength, Decreased range of motion, Postural dysfunction, Pain  Visit Diagnosis: Chronic left shoulder pain  Stiffness of left shoulder, not elsewhere classified  Muscle weakness (generalized)     Problem List There are no active problems to display for this patient.   Jillian Herring, PT, DPT 04/13/2019, 12:51 PM  [facility] [address] [place], [place], [address] Phone: [phone]   Fax:  [phone]  Name: Jillian Herring MRN: [id number] Date of Birth: 10/24/1975

## 2019-04-15 ENCOUNTER — Ambulatory Visit: Payer: Commercial Managed Care - PPO | Admitting: Physical Therapy

## 2019-04-15 ENCOUNTER — Encounter: Payer: Self-pay | Admitting: Physical Therapy

## 2019-04-15 ENCOUNTER — Other Ambulatory Visit: Payer: Self-pay

## 2019-04-15 DIAGNOSIS — M25612 Stiffness of left shoulder, not elsewhere classified: Secondary | ICD-10-CM

## 2019-04-15 DIAGNOSIS — M6281 Muscle weakness (generalized): Secondary | ICD-10-CM

## 2019-04-15 DIAGNOSIS — G8929 Other chronic pain: Secondary | ICD-10-CM

## 2019-04-15 DIAGNOSIS — M25512 Pain in left shoulder: Secondary | ICD-10-CM | POA: Diagnosis not present

## 2019-04-15 NOTE — Therapy (Signed)
[facility] [address] [place], [place], [address] Phone: [phone]   Fax:  [phone]  Physical Therapy Treatment  Patient Details  Name: Jillian Herring MRN: [id number] Date of Birth: 07/27/1975 Referring Provider (Herring): Viviann Spare Case, MD   Encounter Date: 04/15/2019  Herring End of Session - 04/15/19 1130    Visit Number  9    Number of Visits  24    Date for Herring Re-Evaluation  05/18/19    Authorization Type  FOTO; progress note at 10th visit    Herring Start Time  0900    Herring Stop Time  0950    Herring Time Calculation (min)  50 min    Activity Tolerance  Patient tolerated treatment well    Behavior During Therapy  [facility]ring for tasks assessed/performed       History reviewed. No pertinent past medical history.  History reviewed. No pertinent surgical history.  There were no vitals filed for this visit.  Subjective Assessment - 04/15/19 1018    Subjective  COVID-19 screening performed upon arrival. Patient reports feeling a little more stiff today.    Pertinent History  unremarkable    Limitations  Lifting;House hold activities    Diagnostic tests  MRI: increased inferior glenohumeral ligament thickness; X-Ray: normal    Patient Stated Goals  decrease pain, improve ROM    Currently in Pain?  Yes   did not provide number on pain scale        [facility] Herring Assessment - 04/15/19 0001      Assessment   Medical Diagnosis  Chronic left shoulder pain; adhesive capsulitis    Referring Provider (Herring)  Viviann Spare Case, MD    Hand Dominance  Right    Next MD Visit  04/27/2019    Prior Therapy  no                   [facility] Adult Herring Treatment/Exercise - 04/15/19 0001      Shoulder Exercises: Standing   Row  AROM;Both;20 reps   green XTS     Shoulder Exercises: Pulleys   Flexion  5 minutes      Shoulder Exercises: ROM/Strengthening   UBE (Upper Arm Bike)  120 RPM x8 minutes      Modalities   Modalities  Electrical Stimulation;Moist Heat       Moist Heat Therapy   Number Minutes Moist Heat  10 Minutes    Moist Heat Location  Shoulder      Electrical Stimulation   Electrical Stimulation Location  left shoulder    Electrical Stimulation Action  IFC    Electrical Stimulation Parameters  80-150 hz x10 mins    Electrical Stimulation Goals  Pain      Manual Therapy   Manual Therapy  Joint mobilization;Passive ROM    Joint Mobilization  inferior and posterior grade 3-4 joint mobs to improve ROM    Passive ROM  low load duration stretch into ER, flexion, and horizontal abduction. intermittent oscillations to decrease muscle guarding.                  Herring Long Term Goals - 04/13/19 1249      Herring LONG TERM GOAL #1   Title  Patient will be independent with HEP    Time  8    Period  Weeks    Status  Achieved      Herring LONG TERM GOAL #2   Title  Patient will demonstrate 150+ degrees  of left shoulder flexion AROM to improve ability to perform overhead activities.    Time  8    Period  Weeks    Status  On-going      Herring LONG TERM GOAL #3   Title  Patient will demonstrate 55+ degrees of left shoulder ER AROM to improve donning/doffing apparel.    Time  8    Period  Weeks    Status  On-going      Herring LONG TERM GOAL #4   Title  Patient will demonstrate 4/5 left shoulder MMT in all planes to improve stability during functional tasks.    Time  8    Period  Weeks    Status  On-going      Herring LONG TERM GOAL #5   Title  Patient will report ability to perform ADLs with left shoulder pain less than or equal to 3/10.    Time  8    Period  Weeks    Status  Achieved            Plan - 04/15/19 1131    Clinical Impression Statement  Patient responded well to the progression of exercises with minimal reports of pain. Patient was noted with slight increase of tightness with PROM but overall was able to improve range after.    Personal Factors and Comorbidities  Age;Time since onset of injury/illness/exacerbation     Examination-Activity Limitations  Bathing;Reach Overhead;Dressing;Hygiene/Grooming;Lift;Sleep    Stability/Clinical Decision Making  Stable/Uncomplicated    Clinical Decision Making  Low    Rehab Potential  Good    Herring Frequency  3x / week    Herring Duration  8 weeks    Herring Treatment/Interventions  ADLs/Self Care Home Management;Cryotherapy;Electrical Stimulation;Iontophoresis 4mg /ml Dexamethasone;Moist Heat;Ultrasound;Therapeutic exercise;Patient/family education;Therapeutic activities;Neuromuscular re-education;Manual techniques;Vasopneumatic Device;Taping;Dry needling;Passive range of motion    Herring Next Visit Plan  UBE cont with AROM and PROM to left shoulder cont. stretching, modalities PRN for pain relief.    Consulted and Agree with Plan of Care  Patient       Patient will benefit from skilled therapeutic intervention in order to improve the following deficits and impairments:  Decreased activity tolerance, Decreased strength, Decreased range of motion, Postural dysfunction, Pain  Visit Diagnosis: Chronic left shoulder pain  Stiffness of left shoulder, not elsewhere classified  Muscle weakness (generalized)     Problem List There are no active problems to display for this patient.   Jillian Herring, DPT 04/15/2019, 12:38 PM  [facility] [place], [place], [address] Phone: [phone]   Fax:  [phone]  Name: Jillian Herring MRN: [id number] Date of Birth: 1975-08-17

## 2019-04-20 ENCOUNTER — Encounter: Payer: Self-pay | Admitting: Physical Therapy

## 2019-04-20 ENCOUNTER — Ambulatory Visit: Payer: Commercial Managed Care - PPO | Admitting: Physical Therapy

## 2019-04-20 ENCOUNTER — Other Ambulatory Visit: Payer: Self-pay

## 2019-04-20 DIAGNOSIS — M25512 Pain in left shoulder: Secondary | ICD-10-CM | POA: Diagnosis not present

## 2019-04-20 DIAGNOSIS — M25612 Stiffness of left shoulder, not elsewhere classified: Secondary | ICD-10-CM

## 2019-04-20 DIAGNOSIS — M6281 Muscle weakness (generalized): Secondary | ICD-10-CM

## 2019-04-20 DIAGNOSIS — G8929 Other chronic pain: Secondary | ICD-10-CM

## 2019-04-20 NOTE — Therapy (Signed)
[facility] [place], [place], [address] Phone: [phone]   Fax:  [phone]  Physical Therapy Treatment Progress Note Reporting Period 03/23/2019 to 04/20/2019  See note below for Objective Data and Assessment of Progress/Goals. Patient's progress is slow but steady. Patient still compliant with HEP at home.     Patient Details  Name: Jillian Herring MRN: [id number] Date of Birth: [birth year] Referring Provider (PT): Remo Lipps Case, MD   Encounter Date: 04/20/2019  PT End of Session - 04/20/19 1012    Visit Number  10    Number of Visits  24    Date for PT Re-Evaluation  05/18/19    Authorization Type  FOTO; progress note at 10th visit    PT Start Time  0900    PT Stop Time  0950    PT Time Calculation (min)  50 min    Activity Tolerance  Patient tolerated treatment well    Behavior During Therapy  [facility] for tasks assessed/performed       History reviewed. No pertinent past medical history.  History reviewed. No pertinent surgical history.  There were no vitals filed for this visit.  Subjective Assessment - 04/20/19 1011    Subjective  COVID-19 screening performed upon arrival. Patient reports feeling a little more sore today due to an unknown reason.    Pertinent History  unremarkable    Limitations  Lifting;House hold activities    Diagnostic tests  MRI: increased inferior glenohumeral ligament thickness; X-Ray: normal    Patient Stated Goals  decrease pain, improve ROM    Currently in Pain?  Yes    Pain Score  3    with movement, 0/10 at rest   Pain Location  Shoulder    Pain Orientation  Left    Pain Descriptors / Indicators  Sore    Pain Type  Chronic pain    Pain Onset  More than a month ago    Pain Frequency  Constant         [facility] PT Assessment - 04/20/19 0001      Assessment   Medical Diagnosis  Chronic left shoulder pain; adhesive capsulitis    Referring Provider (PT)  Remo Lipps Case, MD    Hand  Dominance  Right    Next MD Visit  04/27/2019    Prior Therapy  no                   [facility] Adult PT Treatment/Exercise - 04/20/19 0001      Exercises   Exercises  Shoulder      Shoulder Exercises: Pulleys   Flexion  5 minutes      Shoulder Exercises: ROM/Strengthening   UBE (Upper Arm Bike)  120 RPM x8 minutes      Modalities   Modalities  Electrical Stimulation;Moist Heat      Moist Heat Therapy   Number Minutes Moist Heat  10 Minutes    Moist Heat Location  Shoulder      Electrical Stimulation   Electrical Stimulation Location  left shoulder    Electrical Stimulation Action  IFC    Electrical Stimulation Parameters  80-150 hz x10 mins    Electrical Stimulation Goals  Pain      Manual Therapy   Manual Therapy  Joint mobilization;Passive ROM    Joint Mobilization  inferior and posterior grade 3-4 joint mobs to improve ROM    Passive ROM  low load duration stretch into ER, flexion, and horizontal  abduction. intermittent oscillations to decrease muscle guarding.                  PT Long Term Goals - 04/13/19 1249      PT LONG TERM GOAL #1   Title  Patient will be independent with HEP    Time  8    Period  Weeks    Status  Achieved      PT LONG TERM GOAL #2   Title  Patient will demonstrate 150+ degrees of left shoulder flexion AROM to improve ability to perform overhead activities.    Time  8    Period  Weeks    Status  On-going      PT LONG TERM GOAL #3   Title  Patient will demonstrate 55+ degrees of left shoulder ER AROM to improve donning/doffing apparel.    Time  8    Period  Weeks    Status  On-going      PT LONG TERM GOAL #4   Title  Patient will demonstrate 4/5 left shoulder MMT in all planes to improve stability during functional tasks.    Time  8    Period  Weeks    Status  On-going      PT LONG TERM GOAL #5   Title  Patient will report ability to perform ADLs with left shoulder pain less than or equal to 3/10.    Time  8     Period  Weeks    Status  Achieved            Plan - 04/20/19 1012    Clinical Impression Statement  Patient responded well to therapy today with minimal reports of increased pain. Patient noted with slight guarding with end range ER PROM but was able to reduce guarding with oscillations. Patient instructed to continue HEP to which patient reported agreement. No adverse affects upon removal of modalities.    Personal Factors and Comorbidities  Age;Time since onset of injury/illness/exacerbation    Examination-Activity Limitations  Bathing;Reach Overhead;Dressing;Hygiene/Grooming;Lift;Sleep    Examination-Participation Restrictions  Yard Work;Cleaning;Driving    Stability/Clinical Decision Making  Stable/Uncomplicated    Clinical Decision Making  Low    Rehab Potential  Good    PT Frequency  3x / week    PT Duration  8 weeks    PT Treatment/Interventions  ADLs/Self Care Home Management;Cryotherapy;Electrical Stimulation;Iontophoresis 4mg /ml Dexamethasone;Moist Heat;Ultrasound;Therapeutic exercise;Patient/family education;Therapeutic activities;Neuromuscular re-education;Manual techniques;Vasopneumatic Device;Taping;Dry needling;Passive range of motion    PT Next Visit Plan  FOTO; goals, UBE, AROM and PROM stretching; modalities for pain relief.    PT Home Exercise Plan  see patient education section    Consulted and Agree with Plan of Care  Patient       Patient will benefit from skilled therapeutic intervention in order to improve the following deficits and impairments:  Decreased activity tolerance, Decreased strength, Decreased range of motion, Postural dysfunction, Pain  Visit Diagnosis: Chronic left shoulder pain  Stiffness of left shoulder, not elsewhere classified  Muscle weakness (generalized)     Problem List There are no active problems to display for this patient.   , PT, DPT 04/20/2019, 10:18 AM  [facility] [address] [place], [place], [place] Phone: [phone]   Fax:  [phone]  Name: Jillian Herring MRN: [name] Date of Birth: 11-Feb-1976

## 2019-04-26 ENCOUNTER — Other Ambulatory Visit: Payer: Self-pay

## 2019-04-26 ENCOUNTER — Ambulatory Visit: Payer: Commercial Managed Care - PPO | Admitting: Physical Therapy

## 2019-04-26 DIAGNOSIS — M6281 Muscle weakness (generalized): Secondary | ICD-10-CM

## 2019-04-26 DIAGNOSIS — M25512 Pain in left shoulder: Secondary | ICD-10-CM | POA: Diagnosis not present

## 2019-04-26 DIAGNOSIS — M25612 Stiffness of left shoulder, not elsewhere classified: Secondary | ICD-10-CM

## 2019-04-26 DIAGNOSIS — G8929 Other chronic pain: Secondary | ICD-10-CM

## 2019-04-26 NOTE — Therapy (Signed)
[facility] [address] [place], [place], [address] Phone: [phone]   Fax:  [phone]  Physical Therapy Treatment  Patient Details  Name: Jillian Herring MRN: [id number] Date of Birth: Dec 17, 1975 Referring Provider (PT): Viviann Spare Case, MD   Encounter Date: 04/26/2019  PT End of Session - 04/26/19 1032    Visit Number  11    Number of Visits  24    Date for PT Re-Evaluation  05/18/19    Authorization Type  FOTO; progress note at 10th visit    PT Start Time  0945    PT Stop Time  1042    PT Time Calculation (min)  57 min    Activity Tolerance  Patient tolerated treatment well    Behavior During Therapy  [facility] for tasks assessed/performed       No past medical history on file.  No past surgical history on file.  There were no vitals filed for this visit.  Subjective Assessment - 04/26/19 1041    Subjective  COVID-19 screening performed upon arrival. Patient reports she was able to play tennis with tolerable pain in the left shoulder.    Pertinent History  unremarkable    Limitations  Lifting;House hold activities    Diagnostic tests  MRI: increased inferior glenohumeral ligament thickness; X-Ray: normal    Patient Stated Goals  decrease pain, improve ROM    Currently in Pain?  Yes   pain with movement not at rest        [facility] PT Assessment - 04/26/19 0001      Assessment   Medical Diagnosis  Chronic left shoulder pain; adhesive capsulitis    Referring Provider (PT)  Viviann Spare Case, MD    Hand Dominance  Right    Next MD Visit  04/27/2019    Prior Therapy  no                   [facility] Adult PT Treatment/Exercise - 04/26/19 0001      Exercises   Exercises  Shoulder      Shoulder Exercises: Pulleys   Flexion  5 minutes      Shoulder Exercises: ROM/Strengthening   UBE (Upper Arm Bike)  90 RPM x8 minutes      Modalities   Modalities  Electrical Stimulation;Cryotherapy      Cryotherapy   Number Minutes  Cryotherapy  10 Minutes    Cryotherapy Location  Shoulder    Type of Cryotherapy  Ice pack      Electrical Stimulation   Electrical Stimulation Location  left shoulder     Electrical Stimulation Action  IFC    Electrical Stimulation Parameters  80-150 hz x10 mins    Electrical Stimulation Goals  Pain      Manual Therapy   Manual Therapy  Joint mobilization;Passive ROM    Joint Mobilization  inferior and posterior grade 3-4 joint mobs to improve ROM    Passive ROM  low load duration stretch into ER, flexion, and horizontal abduction. intermittent oscillations to decrease muscle guarding. rhythmic stabilization into ER.                   PT Long Term Goals - 04/13/19 1249      PT LONG TERM GOAL #1   Title  Patient will be independent with HEP    Time  8    Period  Weeks    Status  Achieved      PT LONG TERM GOAL #2  Title  Patient will demonstrate 150+ degrees of left shoulder flexion AROM to improve ability to perform overhead activities.    Time  8    Period  Weeks    Status  On-going      PT LONG TERM GOAL #3   Title  Patient will demonstrate 55+ degrees of left shoulder ER AROM to improve donning/doffing apparel.    Time  8    Period  Weeks    Status  On-going      PT LONG TERM GOAL #4   Title  Patient will demonstrate 4/5 left shoulder MMT in all planes to improve stability during functional tasks.    Time  8    Period  Weeks    Status  On-going      PT LONG TERM GOAL #5   Title  Patient will report ability to perform ADLs with left shoulder pain less than or equal to 3/10.    Time  8    Period  Weeks    Status  Achieved            Plan - 04/26/19 1050    Clinical Impression Statement  Patient responded well to increase of resistance of UBE with good form. Patient noted with improve flexion and horizontal PROM but still with tightness with ER and IR. Slight muscle guarding particularly with ER but reduced with oscillations. Patient's FOTO  improved to 38% limitation. Patient noted with no adverse affects with ice pack and e-stim upon removal.    Personal Factors and Comorbidities  Age;Time since onset of injury/illness/exacerbation    Examination-Activity Limitations  Bathing;Reach Overhead;Dressing;Hygiene/Grooming;Lift;Sleep    Examination-Participation Restrictions  Yard Work;Cleaning;Driving    Stability/Clinical Decision Making  Stable/Uncomplicated    Clinical Decision Making  Low    Rehab Potential  Good    PT Frequency  3x / week    PT Duration  8 weeks    PT Treatment/Interventions  ADLs/Self Care Home Management;Cryotherapy;Electrical Stimulation;Iontophoresis 4mg /ml Dexamethasone;Moist Heat;Ultrasound;Therapeutic exercise;Patient/family education;Therapeutic activities;Neuromuscular re-education;Manual techniques;Vasopneumatic Device;Taping;Dry needling;Passive range of motion    PT Next Visit Plan  continue with UBE, AROM and PROM stretching; modalities for pain relief.    Consulted and Agree with Plan of Care  Patient       Patient will benefit from skilled therapeutic intervention in order to improve the following deficits and impairments:  Decreased activity tolerance, Decreased strength, Decreased range of motion, Postural dysfunction, Pain  Visit Diagnosis: Chronic left shoulder pain  Stiffness of left shoulder, not elsewhere classified  Muscle weakness (generalized)     Problem List There are no active problems to display for this patient.   Gabriela Eves, PT, DPT 04/26/2019, 11:03 AM  [facility] [address] [place], [place], [address] Phone: [phone]   Fax:  [phone]  Name: Quyen Cutsforth MRN: [id number] Date of Birth: 1975-11-09

## 2019-05-02 ENCOUNTER — Ambulatory Visit: Payer: Commercial Managed Care - PPO | Admitting: Physical Therapy

## 2019-05-18 ENCOUNTER — Ambulatory Visit: Payer: Commercial Managed Care - PPO | Attending: Orthopedic Surgery | Admitting: Physical Therapy

## 2019-05-18 ENCOUNTER — Other Ambulatory Visit: Payer: Self-pay

## 2019-05-18 DIAGNOSIS — M6281 Muscle weakness (generalized): Secondary | ICD-10-CM

## 2019-05-18 DIAGNOSIS — G8929 Other chronic pain: Secondary | ICD-10-CM | POA: Diagnosis present

## 2019-05-18 DIAGNOSIS — M25512 Pain in left shoulder: Secondary | ICD-10-CM | POA: Diagnosis present

## 2019-05-18 DIAGNOSIS — M25612 Stiffness of left shoulder, not elsewhere classified: Secondary | ICD-10-CM | POA: Diagnosis present

## 2019-05-18 NOTE — Therapy (Signed)
[facility] [place], [place], [address] Phone: [phone]   Fax:  [phone]  Physical Therapy Treatment  Patient Details  Name: Jillian Herring MRN: [id number] Date of Birth: 10-30-1975 Referring Provider (PT): Remo Lipps Case, MD   Encounter Date: 05/18/2019  PT End of Session - 05/18/19 0834    Visit Number  12    Number of Visits  24    Date for PT Re-Evaluation  07/01/19    Authorization Type  FOTO; progress note at 10th visit    PT Start Time  0819    PT Stop Time  0906    PT Time Calculation (min)  47 min    Activity Tolerance  Patient tolerated treatment well    Behavior During Therapy  [facility] for tasks assessed/performed       No past medical history on file.  No past surgical history on file.  There were no vitals filed for this visit.  Subjective Assessment - 05/18/19 0835    Subjective  COVID-19 screening performed upon arrival. Patient states she is feeling "okay" Reports MD's appointment went well and is to continue skilled PT.    Pertinent History  unremarkable    Limitations  Lifting;House hold activities    Diagnostic tests  MRI: increased inferior glenohumeral ligament thickness; X-Ray: normal    Patient Stated Goals  decrease pain, improve ROM    Currently in Pain?  Yes   no pain at rest, pain with movement        [facility] PT Assessment - 05/18/19 0001      Assessment   Medical Diagnosis  Chronic left shoulder pain; adhesive capsulitis    Referring Provider (PT)  Remo Lipps Case, MD    Hand Dominance  Right    Next MD Visit  3 months    Prior Therapy  no                   [facility] Adult PT Treatment/Exercise - 05/18/19 0001      Exercises   Exercises  Shoulder      Shoulder Exercises: Standing   Other Standing Exercises  wall scooter into flexion x20      Shoulder Exercises: Pulleys   Flexion  5 minutes      Shoulder Exercises: ROM/Strengthening   UBE (Upper Arm Bike)  120 RPM x8  minutes      Modalities   Modalities  Electrical Stimulation;Moist Heat      Moist Heat Therapy   Number Minutes Moist Heat  10 Minutes    Moist Heat Location  Shoulder      Electrical Stimulation   Electrical Stimulation Location  left shoulder     Electrical Stimulation Action  IFC    Electrical Stimulation Parameters  80-150 hz x10 mins    Electrical Stimulation Goals  Pain      Manual Therapy   Manual Therapy  Joint mobilization;Passive ROM    Joint Mobilization  inferior and posterior grade 3-4 joint mobs to improve ROM    Passive ROM  low load duration stretch into ER, flexion, and horizontal abduction. intermittent oscillations to decrease muscle guarding. rhythmic stabilization into ER.                   PT Long Term Goals - 04/13/19 1249      PT LONG TERM GOAL #1   Title  Patient will be independent with HEP    Time  8  Period  Weeks    Status  Achieved      PT LONG TERM GOAL #2   Title  Patient will demonstrate 150+ degrees of left shoulder flexion AROM to improve ability to perform overhead activities.    Time  8    Period  Weeks    Status  On-going      PT LONG TERM GOAL #3   Title  Patient will demonstrate 55+ degrees of left shoulder ER AROM to improve donning/doffing apparel.    Time  8    Period  Weeks    Status  On-going      PT LONG TERM GOAL #4   Title  Patient will demonstrate 4/5 left shoulder MMT in all planes to improve stability during functional tasks.    Time  8    Period  Weeks    Status  On-going      PT LONG TERM GOAL #5   Title  Patient will report ability to perform ADLs with left shoulder pain less than or equal to 3/10.    Time  8    Period  Weeks    Status  Achieved            Plan - 05/18/19 0956    Clinical Impression Statement  Patient responded fairly well to TEs with minimal reports of increased pain. Patient guided through progression of exercises as well as periscapular strenthening. Patient's flexion  AROM measured at 140 and ER measured at 40 degrees. No adverse affects upon removal of modalities.    Personal Factors and Comorbidities  Age;Time since onset of injury/illness/exacerbation    Examination-Activity Limitations  Bathing;Reach Overhead;Dressing;Hygiene/Grooming;Lift;Sleep    Examination-Participation Restrictions  Yard Work;Cleaning;Driving    Stability/Clinical Decision Making  Stable/Uncomplicated    Clinical Decision Making  Low    Rehab Potential  Good    PT Frequency  3x / week    PT Duration  8 weeks    PT Treatment/Interventions  ADLs/Self Care Home Management;Cryotherapy;Electrical Stimulation;Iontophoresis 4mg /ml Dexamethasone;Moist Heat;Ultrasound;Therapeutic exercise;Patient/family education;Therapeutic activities;Neuromuscular re-education;Manual techniques;Vasopneumatic Device;Taping;Dry needling;Passive range of motion    PT Next Visit Plan  continue with UBE, AROM and PROM stretching; modalities for pain relief.    Consulted and Agree with Plan of Care  Patient       Patient will benefit from skilled therapeutic intervention in order to improve the following deficits and impairments:  Decreased activity tolerance, Decreased strength, Decreased range of motion, Postural dysfunction, Pain  Visit Diagnosis: Chronic left shoulder pain  Stiffness of left shoulder, not elsewhere classified  Muscle weakness (generalized)     Problem List There are no problems to display for this patient.   , PT, DPT 05/18/2019, 9:59 AM  [facility] [address] [place], [place], [place] Phone: [phone]   Fax:  [phone]  Name: Makiyah Zentz MRN: [name] Date of Birth: 11-03-1975

## 2019-05-24 ENCOUNTER — Encounter: Payer: Self-pay | Admitting: Physical Therapy

## 2019-05-24 ENCOUNTER — Other Ambulatory Visit: Payer: Self-pay

## 2019-05-24 ENCOUNTER — Ambulatory Visit: Payer: Commercial Managed Care - PPO | Admitting: Physical Therapy

## 2019-05-24 DIAGNOSIS — G8929 Other chronic pain: Secondary | ICD-10-CM

## 2019-05-24 DIAGNOSIS — M25612 Stiffness of left shoulder, not elsewhere classified: Secondary | ICD-10-CM

## 2019-05-24 DIAGNOSIS — M25512 Pain in left shoulder: Secondary | ICD-10-CM | POA: Diagnosis not present

## 2019-05-24 DIAGNOSIS — M6281 Muscle weakness (generalized): Secondary | ICD-10-CM

## 2019-05-24 NOTE — Therapy (Addendum)
[facility] [place], [place], [address] Phone: [phone]   Fax:  [phone]  Physical Therapy Treatment  PHYSICAL THERAPY DISCHARGE SUMMARY  Visits from Start of Care: 13   Current functional level related to goals / functional outcomes: See below   Remaining deficits: See goals   Education / Equipment: HEP Plan: Patient agrees to discharge.  Patient goals were partially met. Patient is being discharged due to not returning since the last visit.  ?????  Gabriela Eves, PT, DPT 07/21/19   Patient Details  Name: Corneshia Hines MRN: [id number] Date of Birth: 07-Feb-1976 Referring Provider (PT): Remo Lipps Case, MD   Encounter Date: 05/24/2019  PT End of Session - 05/24/19 1027    Visit Number  13    Number of Visits  24    Date for PT Re-Evaluation  07/01/19    Authorization Type  FOTO; progress note at 10th visit    PT Start Time  0948    PT Stop Time  1041    PT Time Calculation (min)  53 min    Activity Tolerance  Patient tolerated treatment well    Behavior During Therapy  [facility] for tasks assessed/performed       History reviewed. No pertinent past medical history.  History reviewed. No pertinent surgical history.  There were no vitals filed for this visit.  Subjective Assessment - 05/24/19 1034    Subjective  COVID-19 screening performed upon arrival. Patient reported playing tennis yesterday with minimal reports of pain.    Pertinent History  unremarkable    Limitations  Lifting;House hold activities    Diagnostic tests  MRI: increased inferior glenohumeral ligament thickness; X-Ray: normal    Patient Stated Goals  decrease pain, improve ROM    Currently in Pain?  No/denies         [facility] PT Assessment - 05/24/19 0001      Assessment   Medical Diagnosis  Chronic left shoulder pain; adhesive capsulitis    Referring Provider (PT)  Remo Lipps Case, MD    Hand Dominance  Right    Next MD Visit  3 months     Prior Therapy  no                   [facility] Adult PT Treatment/Exercise - 05/24/19 0001      Exercises   Exercises  Shoulder      Shoulder Exercises: Sidelying   External Rotation  AROM;Left;20 reps      Shoulder Exercises: Pulleys   Flexion  5 minutes      Shoulder Exercises: ROM/Strengthening   UBE (Upper Arm Bike)  120 RPM x8 minutes    Wall Wash  flexion, cw, ccw circles x3 mins each      Modalities   Modalities  Electrical Stimulation;Moist Heat      Moist Heat Therapy   Number Minutes Moist Heat  10 Minutes    Moist Heat Location  Shoulder      Electrical Stimulation   Electrical Stimulation Location  left shoulder    Electrical Stimulation Action  modular    Electrical Stimulation Parameters  80-150 hz x15 mins    Electrical Stimulation Goals  Pain      Manual Therapy   Manual Therapy  Passive ROM    Passive ROM  PROM into ER with short holds to improve ROM                  PT Long  Term Goals - 04/13/19 1249      PT LONG TERM GOAL #1   Title  Patient will be independent with HEP    Time  8    Period  Weeks    Status  Achieved      PT LONG TERM GOAL #2   Title  Patient will demonstrate 150+ degrees of left shoulder flexion AROM to improve ability to perform overhead activities.    Time  8    Period  Weeks    Status  On-going      PT LONG TERM GOAL #3   Title  Patient will demonstrate 55+ degrees of left shoulder ER AROM to improve donning/doffing apparel.    Time  8    Period  Weeks    Status  On-going      PT LONG TERM GOAL #4   Title  Patient will demonstrate 4/5 left shoulder MMT in all planes to improve stability during functional tasks.    Time  8    Period  Weeks    Status  On-going      PT LONG TERM GOAL #5   Title  Patient will report ability to perform ADLs with left shoulder pain less than or equal to 3/10.    Time  8    Period  Weeks    Status  Achieved            Plan - 05/24/19 1029    Clinical  Impression Statement  Patient responded well to therapy session but with fatigue with wall washes. Patient educated to rest as needed to maintain proper form. Patient instructed to continue HEP with emphasis on ER ROM. No adverse affects upon removal of modalities.    Personal Factors and Comorbidities  Age;Time since onset of injury/illness/exacerbation    Examination-Activity Limitations  Bathing;Reach Overhead;Dressing;Hygiene/Grooming;Lift;Sleep    Examination-Participation Restrictions  Yard Work;Cleaning;Driving    Stability/Clinical Decision Making  Stable/Uncomplicated    Clinical Decision Making  Low    Rehab Potential  Good    PT Frequency  3x / week    PT Duration  8 weeks    PT Treatment/Interventions  ADLs/Self Care Home Management;Cryotherapy;Electrical Stimulation;Iontophoresis 63m/ml Dexamethasone;Moist Heat;Ultrasound;Therapeutic exercise;Patient/family education;Therapeutic activities;Neuromuscular re-education;Manual techniques;Vasopneumatic Device;Taping;Dry needling;Passive range of motion    PT Next Visit Plan  continue with UBE, AROM and PROM stretching; modalities for pain relief.    PT Home Exercise Plan  see patient education section    Consulted and Agree with Plan of Care  Patient       Patient will benefit from skilled therapeutic intervention in order to improve the following deficits and impairments:  Decreased activity tolerance, Decreased strength, Decreased range of motion, Postural dysfunction, Pain  Visit Diagnosis: Chronic left shoulder pain  Stiffness of left shoulder, not elsewhere classified  Muscle weakness (generalized)     Problem List There are no problems to display for this patient.   KGabriela Eves PT, DPT 05/24/2019, 10:52 AM  [facility] [place] [address]Phone: [phone]  Fax:  [phone] Name: DGissele NarducciMRN: [id number] of Birth:  208-Oct-1977

## 2019-05-31 ENCOUNTER — Ambulatory Visit: Payer: Commercial Managed Care - PPO | Admitting: Physical Therapy

## 2020-01-07 ENCOUNTER — Other Ambulatory Visit: Payer: Self-pay

## 2020-01-07 ENCOUNTER — Emergency Department (HOSPITAL_COMMUNITY)
Admission: EM | Admit: 2020-01-07 | Discharge: 2020-01-07 | Disposition: A | Discharge status: Home / Self Care | Payer: PRIVATE HEALTH INSURANCE
